# Patient Record
Sex: Female | Born: 1975 | Race: White | Hispanic: No | Marital: Married | Smoking: Former smoker
Health system: Southern US, Community
[De-identification: ages and names within clinical notes are randomized; demographics above are authoritative.]

## PROBLEM LIST (undated history)

## (undated) DIAGNOSIS — M1990 Unspecified osteoarthritis, unspecified site: Secondary | ICD-10-CM

## (undated) DIAGNOSIS — R51 Headache: Secondary | ICD-10-CM

## (undated) DIAGNOSIS — R519 Headache, unspecified: Secondary | ICD-10-CM

## (undated) DIAGNOSIS — K589 Irritable bowel syndrome without diarrhea: Secondary | ICD-10-CM

## (undated) DIAGNOSIS — G4731 Primary central sleep apnea: Secondary | ICD-10-CM

## (undated) DIAGNOSIS — R413 Other amnesia: Secondary | ICD-10-CM

## (undated) DIAGNOSIS — G8929 Other chronic pain: Secondary | ICD-10-CM

## (undated) DIAGNOSIS — G4739 Other sleep apnea: Secondary | ICD-10-CM

## (undated) DIAGNOSIS — M797 Fibromyalgia: Secondary | ICD-10-CM

## (undated) HISTORY — DX: Primary central sleep apnea: G47.31

## (undated) HISTORY — PX: OTHER SURGICAL HISTORY: SHX169

## (undated) HISTORY — DX: Headache: R51

## (undated) HISTORY — DX: Other amnesia: R41.3

## (undated) HISTORY — PX: TUBAL LIGATION: SHX77

## (undated) HISTORY — DX: Fibromyalgia: M79.7

## (undated) HISTORY — DX: Other sleep apnea: G47.39

## (undated) HISTORY — DX: Headache, unspecified: R51.9

## (undated) HISTORY — DX: Unspecified osteoarthritis, unspecified site: M19.90

## (undated) HISTORY — DX: Other chronic pain: G89.29

## (undated) HISTORY — DX: Irritable bowel syndrome, unspecified: K58.9

---

## 1998-09-08 ENCOUNTER — Encounter: Payer: Self-pay | Admitting: *Deleted

## 1998-09-08 ENCOUNTER — Observation Stay (HOSPITAL_COMMUNITY): Admission: AD | Admit: 1998-09-08 | Discharge: 1998-09-09 | Payer: Self-pay | Admitting: *Deleted

## 1998-11-10 ENCOUNTER — Other Ambulatory Visit: Admission: RE | Admit: 1998-11-10 | Discharge: 1998-11-10 | Payer: Self-pay | Admitting: *Deleted

## 1998-11-10 ENCOUNTER — Encounter (INDEPENDENT_AMBULATORY_CARE_PROVIDER_SITE_OTHER): Payer: Self-pay | Admitting: Specialist

## 1998-11-23 ENCOUNTER — Inpatient Hospital Stay (HOSPITAL_COMMUNITY): Admission: AD | Admit: 1998-11-23 | Discharge: 1998-11-23 | Payer: Self-pay | Admitting: Obstetrics & Gynecology

## 1999-01-22 ENCOUNTER — Ambulatory Visit (HOSPITAL_COMMUNITY): Admission: RE | Admit: 1999-01-22 | Discharge: 1999-01-22 | Payer: Self-pay | Admitting: *Deleted

## 2000-03-31 ENCOUNTER — Other Ambulatory Visit: Admission: RE | Admit: 2000-03-31 | Discharge: 2000-03-31 | Payer: Self-pay | Admitting: Obstetrics and Gynecology

## 2000-04-26 ENCOUNTER — Ambulatory Visit (HOSPITAL_COMMUNITY): Admission: RE | Admit: 2000-04-26 | Discharge: 2000-04-26 | Payer: Self-pay | Admitting: Obstetrics and Gynecology

## 2000-12-16 ENCOUNTER — Ambulatory Visit (HOSPITAL_COMMUNITY): Admission: RE | Admit: 2000-12-16 | Discharge: 2000-12-16 | Payer: Self-pay | Admitting: *Deleted

## 2001-09-20 ENCOUNTER — Other Ambulatory Visit: Admission: RE | Admit: 2001-09-20 | Discharge: 2001-09-20 | Payer: Self-pay | Admitting: *Deleted

## 2001-10-16 ENCOUNTER — Ambulatory Visit (HOSPITAL_COMMUNITY): Admission: RE | Admit: 2001-10-16 | Discharge: 2001-10-16 | Payer: Self-pay | Admitting: *Deleted

## 2001-11-08 ENCOUNTER — Inpatient Hospital Stay (HOSPITAL_COMMUNITY): Admission: RE | Admit: 2001-11-08 | Discharge: 2001-11-08 | Payer: Self-pay | Admitting: *Deleted

## 2002-04-09 ENCOUNTER — Inpatient Hospital Stay (HOSPITAL_COMMUNITY): Admission: AD | Admit: 2002-04-09 | Discharge: 2002-04-09 | Payer: Self-pay | Admitting: *Deleted

## 2002-11-13 ENCOUNTER — Other Ambulatory Visit: Admission: RE | Admit: 2002-11-13 | Discharge: 2002-11-13 | Payer: Self-pay | Admitting: *Deleted

## 2004-10-29 ENCOUNTER — Other Ambulatory Visit: Admission: RE | Admit: 2004-10-29 | Discharge: 2004-10-29 | Payer: Self-pay | Admitting: Obstetrics and Gynecology

## 2005-03-15 ENCOUNTER — Encounter: Admission: RE | Admit: 2005-03-15 | Discharge: 2005-03-15 | Payer: Self-pay | Admitting: Obstetrics and Gynecology

## 2006-01-14 ENCOUNTER — Other Ambulatory Visit: Admission: RE | Admit: 2006-01-14 | Discharge: 2006-01-14 | Payer: Self-pay | Admitting: Obstetrics and Gynecology

## 2006-05-09 ENCOUNTER — Ambulatory Visit (HOSPITAL_COMMUNITY): Admission: RE | Admit: 2006-05-09 | Discharge: 2006-05-09 | Payer: Self-pay | Admitting: Obstetrics and Gynecology

## 2006-08-11 ENCOUNTER — Other Ambulatory Visit: Admission: RE | Admit: 2006-08-11 | Discharge: 2006-08-11 | Payer: Self-pay | Admitting: Obstetrics and Gynecology

## 2006-11-03 ENCOUNTER — Ambulatory Visit (HOSPITAL_COMMUNITY): Admission: RE | Admit: 2006-11-03 | Discharge: 2006-11-03 | Payer: Self-pay | Admitting: Obstetrics and Gynecology

## 2007-04-04 ENCOUNTER — Inpatient Hospital Stay (HOSPITAL_COMMUNITY): Admission: AD | Admit: 2007-04-04 | Discharge: 2007-04-04 | Payer: Self-pay | Admitting: Obstetrics and Gynecology

## 2007-04-04 ENCOUNTER — Inpatient Hospital Stay (HOSPITAL_COMMUNITY): Admission: AD | Admit: 2007-04-04 | Discharge: 2007-04-06 | Payer: Self-pay | Admitting: Obstetrics and Gynecology

## 2009-03-06 ENCOUNTER — Inpatient Hospital Stay (HOSPITAL_COMMUNITY): Admission: AD | Admit: 2009-03-06 | Discharge: 2009-03-06 | Payer: Self-pay | Admitting: Obstetrics and Gynecology

## 2009-07-08 ENCOUNTER — Inpatient Hospital Stay (HOSPITAL_COMMUNITY): Admission: AD | Admit: 2009-07-08 | Discharge: 2009-07-10 | Payer: Self-pay | Admitting: Obstetrics and Gynecology

## 2009-09-10 ENCOUNTER — Ambulatory Visit (HOSPITAL_COMMUNITY): Admission: RE | Admit: 2009-09-10 | Discharge: 2009-09-10 | Payer: Self-pay | Admitting: Obstetrics and Gynecology

## 2010-01-26 ENCOUNTER — Encounter: Admission: RE | Admit: 2010-01-26 | Discharge: 2010-01-26 | Payer: Self-pay | Admitting: Obstetrics and Gynecology

## 2010-04-19 HISTORY — PX: BREAST CYST ASPIRATION: SHX578

## 2010-05-25 ENCOUNTER — Other Ambulatory Visit: Payer: Self-pay | Admitting: Obstetrics and Gynecology

## 2010-05-25 DIAGNOSIS — N6320 Unspecified lump in the left breast, unspecified quadrant: Secondary | ICD-10-CM

## 2010-05-26 ENCOUNTER — Ambulatory Visit
Admission: RE | Admit: 2010-05-26 | Discharge: 2010-05-26 | Disposition: A | Discharge status: Home / Self Care | Payer: BC Managed Care – PPO | Source: Ambulatory Visit | Attending: Obstetrics and Gynecology | Admitting: Obstetrics and Gynecology

## 2010-05-26 ENCOUNTER — Other Ambulatory Visit: Payer: Self-pay | Admitting: Obstetrics and Gynecology

## 2010-05-26 DIAGNOSIS — N6320 Unspecified lump in the left breast, unspecified quadrant: Secondary | ICD-10-CM

## 2010-07-06 LAB — CBC
HCT: 41.6 % (ref 36.0–46.0)
MCHC: 34.2 g/dL (ref 30.0–36.0)
Platelets: 189 10*3/uL (ref 150–400)
RDW: 12.9 % (ref 11.5–15.5)

## 2010-07-06 LAB — PREGNANCY, URINE: Preg Test, Ur: NEGATIVE

## 2010-07-10 LAB — RPR: RPR Ser Ql: NONREACTIVE

## 2010-07-10 LAB — CBC
Hemoglobin: 11.4 g/dL — ABNORMAL LOW (ref 12.0–15.0)
Platelets: 147 10*3/uL — ABNORMAL LOW (ref 150–400)
RBC: 3.52 MIL/uL — ABNORMAL LOW (ref 3.87–5.11)
WBC: 13.4 10*3/uL — ABNORMAL HIGH (ref 4.0–10.5)

## 2010-09-04 NOTE — Op Note (Signed)
[facility] of [facility]  Patient:    Regina Long, Regina Long Visit Number: [id number] MRN: [id number]          Service Type: DSU Location: [facility] Attending Physician:  Pleas Koch Dictated by:   Georgina Peer, M.D. Proc. Date: 10/16/01 Admit Date:  10/16/2001                             Operative Report  PREOPERATIVE DIAGNOSES:       1. Left lower quadrant and chronic pelvic pain.                               2. History of endometriosis.  POSTOPERATIVE DIAGNOSES:      1. Left lower quadrant and chronic pelvic pain.                               2. History of endometriosis.  OPERATIONS PERFORMED:         1. Diagnostic laparoscopy.                               2. Fulguration of endometriosis of left ovarian                                  fossa and cul-de-sac.                               3. Uterine and ovarian transection.  SURGEON:                      Georgina Peer, M.D.  ANESTHESIA:                   General with a total of 9 cc of 0.25% Marcaine in the skin incisions.  COMPLICATIONS:                None.  FINDINGS:                     Superficial implants of endometriosis of the left ovarian fossa, cul-de-sac and uterosacral ligaments.  Normal tubes and ovaries.  Normal upper abdomen.  INDICATIONS:                  A 35 year old white female with a history of endometriosis and previous laparoscopy x2.  The patient has been on Depo-Provera for suppression, but continues to have left lower quadrant and pelvic pain.  She uses Darvocet, Bextra and a TENS unit for this without optimal relief.  She understands the risks and complications of laparoscopy. Also, that no disease might be found or that any treatment may not improve her pain symptoms.  She is willing to proceed.  DESCRIPTION OF PROCEDURE:     The patient was taken to the operating room after informed consent, given a general anesthetic and placed in the dorsal lithotomy position.  The  abdomen, perineum and vagina were cleansed with Betadine and sterilely draped.  A catheter emptied the bladder.  Bimanual revealed a retroflexed uterus which was mobile with no uterosacral nodularity and no adnexal mass.  After injecting the skin sites with 0.25%  Marcaine, a vertical subumbilical incision was made and 2.5 L of carbon dioxide gas was insufflated to create pneumoperitoneum under low pressure.  Under direct vision, suprapubic and right lower quadrant incisions were made after again injecting 0.25% Marcaine.  The following pelvic findings were noted once all the ports were placed.  There appeared to be no entry from laparoscopic trocar placement.  There appeared to be no upper abdominal disease.  The tubes and ovaries were freely mobile.  Behind the left ovary, there was one filmy adhesion that was lysed.  There was evidence of neovascularization and implants of endometriosis in the left ovarian fossa over the ureter into the area of the left uterosacral ligament.  The cul-de-sac had multiple implants and neovascularization, but no retraction pockets or stellate scarring.  Both red and clear blebs of endometriosis were noted.  This extended all the way to the right uterosacral ligament, but not into the right ovarian fossa or the other peritoneal surfaces.  The tubes and ovaries were clear.  Using a harmonic scalpel with the dissecting probe, blunt ablation of the endometriosis implants was accomplished.  Superficial ablation over the left ureter and in the left ovarian fossa was accomplished.  Uterosacral transection using the dissecting hook, taking special care to avoid the ureteral courses was also accomplished.  There was excellent hemostasis.  A Nezhat irrigator removed any debris.  Photo documentation of all these findings was made with still pictures.  At this point, the procedure was terminated.  The patient was awakened after all ports were removed and the skin  incisions closed with Dexon.  The vaginal instruments were removed that had been placed to manipulate the uterus.  Sponge, needle and instrument counts were correct.  The patient was transferred to the recovery area in good condition. Dictated by:   Georgina Peer, M.D. Attending Physician:  Pleas Koch DD:  10/16/01 TD:  10/16/01 Job: [id number]

## 2010-09-04 NOTE — Op Note (Signed)
[facility] of [facility]  Patient:    Regina Long, Regina Long                         MRN: [id number] Proc. Date: 04/26/00 Attending:  Fayrene Fearing A. Ashley Royalty, M.D.                           Operative Report  PREOPERATIVE DIAGNOSES:       1. Pelvic pain.                               2. History of endometriosis.  POSTOPERATIVE DIAGNOSES:      1. Pelvic pain.                               2. Adhesion (minimal) from the left colon                                  to the left pelvic side wall.  PROCEDURE:                    1. Diagnostic/operative laparoscopy.                               2. Lysis of adhesions.  SURGEON:                      Rudy Jew. Ashley Royalty, M.D.  ANESTHESIA:                   General endotracheal.  ESTIMATED BLOOD LOSS:         Less than 25 cc.  COMPLICATIONS:                None.  PACKS/DRAINS:                 None.  DESCRIPTION OF PROCEDURE:     The patient was taken to the operating room and placed in the dorsal supine position, where adequate general endotracheal anesthesia was administered.  She was placed in the lithotomy position and prepped and draped in the usual manner for abdominal and vaginal surgery.  A posterior weighted retractor was placed in the vagina, and the anterior lip of the cervix was grasped with a single tooth tenaculum.  The trocar and uterine manipulator were placed per cervix and held in place with the tenaculum.  The bladder was drained with a red rubber catheter.  Next, approximately 5 cc of 0.25% Marcaine was instilled into the inferior aspect of the umbilicus.  A longitudinal incision was made through the patients old laparoscopy incision. The size #1011 disposable trocar was then placed into the abdominal cavity. Its location was verified by the placement of the laparoscope. There was no evidence of any trauma.  A pneumoperitoneum was created and maintained with CO2.  Next, a 5 mm suprapubic trocar was placed using  direct visualization and transillumination techniques.  The pelvis was thoroughly surveyed.  The uterus was normal size, shape, and contour, without evidence of any endometriosis or cysts.  The left and right ovaries were normal size, shape, and contour, without evidence of any cysts or endometritis.  The fallopian tubes were bilaterally normal  size, shape, contour, and length, with luxuriant fimbria bilaterally.  The peritoneal surfaces were smooth and glistening.  The posterior cul-de-sac was cleaned as well.  There was a small adhesion from the left colon to the left pelvic side wall.  Using the bipolar cautery, this was cauterized and divided with the scissors, without difficulty.  Hemostasis was noted.  Appropriate photos were obtained.  At this point the patient was felt to have benefited maximally from the surgical procedure.  The abdominal instruments were removed and the pneumoperitoneum evacuated.  The fascial defects were closed with #0 Vicryl.  The skin was closed with DermaBond.  The patient tolerated the procedure extremely well.  The vaginal instruments were removed, and she was taken to the recovery room in excellent condition. DD:  04/26/00 TD:  04/26/00 Job: [id number]

## 2010-09-04 NOTE — H&P (Signed)
[facility]  Patient:    Regina Long, Regina Long                         MRN: [id number] Adm. Date:  04/26/00 Attending:  Fayrene Fearing A. Ashley Royalty, M.D.                         History and Physical  HISTORY OF PRESENT ILLNESS:   This is a 35 year old nulligravida referred through the courtesy of Melbourne Abts, M.D., for further evaluation and therapy of endometriosis.  The patient had a diagnostic/operative laparoscopy October 2000, which revealed endometriotic implants in the cul-de-sac and left ovarian fossa.  They were fulgurated at the time of the procedure.  The patient did well initially postoperatively.  However, in the spring of 2001, she began to have pelvic pain and postcoital discomfort.  She was begun on Lupron Depot, which resulted in no significant improvement.  In July 2001, her symptoms became spontaneously better.  Then in August they were substantially worse. In October she was requiring hydrocodone on a regular basis to maintain normal function and, thus, requests operative intervention.  MEDICATIONS:                  Hydrocodone.  PAST MEDICAL HISTORY:         Negative.  PAST SURGICAL HISTORY:        Laparoscopy as above.  ALLERGIES:                    None.  FAMILY HISTORY:               Positive for hypertension, diabetes, and breast carcinoma.  SOCIAL HISTORY:               Denies use of tobacco or significant alcohol.  REVIEW OF SYSTEMS:            Noncontributory.  PHYSICAL EXAMINATION:  GENERAL:                      Well-developed, well-nourished, pleasant white female in no acute distress.  VITAL SIGNS:                  Afebrile, vital signs stable.  SKIN:                         Warm and dry, without lesions.  LYMPH:                        No supraclavicular, cervical, or inguinal adenopathy.  HEENT:                        Normocephalic.  NECK:                         Supple.  Without thyromegaly.  CHEST:                         Clear.  LUNGS:                        Clear.  CARDIAC:                      Regular rate and rhythm.  Without murmurs, gallops, or rubs.  BREASTS:                      Deferred.  ABDOMEN:                      Soft and nontender.  Without masses or organomegaly.  Bowel sounds active.  MUSCULOSKELETAL:              Full range of motion without edema, cyanosis, or CVA tenderness.  PELVIC:                       Deferred until examination under anesthesia.  IMPRESSION:                   1. Endometriosis.                               2. Status post diagnostic/operative laparoscopy                                  October 2000, for same.                               3. Pelvic pain and postcoital discomfort,                                  debilitating, probably secondary to #1.  PLAN:                         Diagnostic/operative laparoscopy.  Risks, benefits, ______ , anal trauma ______  fully discussed with the patient. Possible need for unilateral salpingo-oophorectomy discussed and accepted. Possible need for exploratory laparotomy discussed and accepted.  Questions were invited and then answered. DD:  04/25/00 TD:  04/25/00 Job: [id number]

## 2011-01-22 LAB — CBC
HCT: 31.9 — ABNORMAL LOW
HCT: 39.7
Hemoglobin: 11.1 — ABNORMAL LOW
Hemoglobin: 13.8
MCHC: 34.7
MCHC: 34.8
MCV: 93.4
RBC: 4.25
RDW: 12.8

## 2011-01-22 LAB — CCBB MATERNAL DONOR DRAW

## 2011-11-15 ENCOUNTER — Encounter (HOSPITAL_BASED_OUTPATIENT_CLINIC_OR_DEPARTMENT_OTHER): Payer: BC Managed Care – PPO

## 2011-12-05 ENCOUNTER — Ambulatory Visit (HOSPITAL_BASED_OUTPATIENT_CLINIC_OR_DEPARTMENT_OTHER): Payer: BC Managed Care – PPO | Attending: Internal Medicine

## 2011-12-05 VITALS — Ht 64.0 in | Wt 122.0 lb

## 2011-12-05 DIAGNOSIS — Z79899 Other long term (current) drug therapy: Secondary | ICD-10-CM | POA: Insufficient documentation

## 2011-12-05 DIAGNOSIS — G4733 Obstructive sleep apnea (adult) (pediatric): Secondary | ICD-10-CM | POA: Insufficient documentation

## 2011-12-11 DIAGNOSIS — Z79899 Other long term (current) drug therapy: Secondary | ICD-10-CM

## 2011-12-11 DIAGNOSIS — G4733 Obstructive sleep apnea (adult) (pediatric): Secondary | ICD-10-CM

## 2011-12-11 NOTE — Procedures (Signed)
Regina Long, Regina Long                ACCOUNT NO.:  [id number]  MEDICAL RECORD NO.:  [id number]          PATIENT TYPE:  OUT  LOCATION:  SLEEP CENTER                 FACILITY[facility]  [facility]  PHYSICIAN:  Hyland Mollenkopf D. Maple Hudson, MD, FCCP, FACPDATE OF BIRTH:  May 28, 1974  DATE OF STUDY:  12/05/2011                           NOCTURNAL POLYSOMNOGRAM  REFERRING PHYSICIAN:  Irianna Gilday D. Maple Hudson, MD, FCCP, FACP  REFERRING PHYSICIAN:  Dr. Hassie Bruce.  INDICATION FOR STUDY:  Hypersomnia with sleep apnea.  EPWORTH SLEEPINESS SCORE:  19/24.  BMI 20.9, weight 122 pounds, height 64 inches, neck 12 inches.  HOME MEDICATIONS:  Charted and reviewed.  SLEEP ARCHITECTURE:  Total sleep time 352 minutes with sleep efficiency 88.9%.  Stage I was 9.4%, stage II 81.4%.  Stage III absent, REM 9.2% of total sleep time.  Sleep latency 14 minutes, REM latency 90 minutes. Awake after sleep onset 30 minutes.  Arousal index 14.1.  BEDTIME MEDICATION:  Estazolam, Cymbalta, vitamin B6, Lidoderm.  RESPIRATORY DATA:  Apnea/hypopnea index (AHI) 16.4 per hour.  A total of 96 events were scored including 7 obstructive apneas, 65 central apneas, 1 mixed apnea, 23 hypopneas.  Events were not positional.  REM AHI 24 per hour.  There were not enough early events to meet protocol requirements for application of split protocol, CPAP titration on this study night.  OXYGEN DATA:  Loud snoring with oxygen desaturation to a nadir of 86% and mean oxygen saturation through the study of 96.4% on room air.  CARDIAC DATA:  Normal sinus rhythm.  MOVEMENT/PARASOMNIA:  No significant movement disturbance.  Bathroom x1.  IMPRESSION/RECOMMENDATION: 1. Moderate obstructive sleep apnea/hypopnea syndrome, AHI 16.4 per     hour.  Non-positional events.  Loud snoring with oxygen     desaturation to a nadir of 86% and a mean oxygen saturation through     the study of 96.4% on room air. 2. There were insufficient early events to meet protocol  requirements     for application of split protocol, CPAP     titration on this study night.  Consider return for dedicated CPAP     titration or evaluate for alternative management as clinically     appropriate.     Robbin Loughmiller D. Maple Hudson, MD, [facility], FACP Diplomate, American Board of Sleep Medicine    CDY/MEDQ  D:  12/11/2011 09:20:07  T:  12/11/2011 10:19:38  Job:  [id number]

## 2011-12-30 ENCOUNTER — Ambulatory Visit (HOSPITAL_BASED_OUTPATIENT_CLINIC_OR_DEPARTMENT_OTHER): Payer: BC Managed Care – PPO | Attending: Internal Medicine

## 2011-12-30 VITALS — Ht 64.0 in | Wt 122.0 lb

## 2011-12-30 DIAGNOSIS — G4733 Obstructive sleep apnea (adult) (pediatric): Secondary | ICD-10-CM

## 2011-12-30 DIAGNOSIS — G4710 Hypersomnia, unspecified: Secondary | ICD-10-CM | POA: Insufficient documentation

## 2011-12-30 DIAGNOSIS — G4730 Sleep apnea, unspecified: Secondary | ICD-10-CM | POA: Insufficient documentation

## 2012-01-08 DIAGNOSIS — G4710 Hypersomnia, unspecified: Secondary | ICD-10-CM

## 2012-01-08 DIAGNOSIS — G4730 Sleep apnea, unspecified: Secondary | ICD-10-CM

## 2012-01-09 NOTE — Procedures (Signed)
Regina Long, Regina Long                ACCOUNT NO.:  [id number]  MEDICAL RECORD NO.:  [id number]          PATIENT TYPE:  OUT  LOCATION:  SLEEP CENTER                 FACILITY:  [facility]  PHYSICIAN:  Owen Pagnotta D. Maple Hudson, MD, FCCP, FACPDATE OF BIRTH:  04/17/1975  DATE OF STUDY:  12/30/2011                           NOCTURNAL POLYSOMNOGRAM  REFERRING PHYSICIAN:  Leonette Most Lapp  INDICATION FOR STUDY:  Hypersomnia with sleep apnea.  EPWORTH SLEEPINESS SCORE:  19/24.  BMI 20.9, weight 122 pounds, height 64 inches, neck 12 inches.  MEDICATIONS:  Home medications are charted and reviewed.  A baseline diagnostic NPSG on December 06, 2011, recorded an AHI of 16.4 per hour.  Body weight was 222 pounds for that study.  CPAP titration is now requested.  SLEEP ARCHITECTURE:  Total sleep time 327.5 minutes with sleep efficiency 92.4%.  Stage I was 3.7%, stage II 82.1%, stage III 0.5%, REM 13.7% of total sleep time.  Sleep latency 9.5 minutes, REM latency 72 minutes.  Awake after sleep onset 7.5 minutes.  Arousal index 7.  BEDTIME MEDICATION:  Estazolam, Cymbalta, vitamin B6, Lidoderm.  RESPIRATORY DATA:  CPAP titration protocol.  CPAP was titrated to 10 CWP, AHI 0 per hour.  She wore a Radiographer, therapeutic and Paykel one size mask with heated humidifier.  OXYGEN DATA:  Mean oxygen saturation 96.6% on room air.  Snoring was prevented.  CARDIAC DATA:  Sinus rhythm.  MOVEMENT-PARASOMNIA:  No significant movement disturbance.  No bathroom trips.  IMPRESSIONS-RECOMMENDATIONS: 1. Successful CPAP titration to 10 CWP, AHI 0 per hour.  She wore a     Oceanographer one size mask with heated humidifier.     Snoring was prevented and mean oxygen saturation held 96.6% on room     air. 2. Baseline diagnostic NPSG on December 05, 2011, had recorded an AHI of     16.4 per hour.  Body weight for that study was 122 pounds.     Tichina Koebel D. Maple Hudson, MD, [facility], FACP Diplomate, American Board of Sleep  Medicine    CDY/MEDQ  D:  01/08/2012 14:48:28  T:  01/09/2012 01:29:07  Job:  [id number]

## 2012-02-25 ENCOUNTER — Encounter: Payer: Self-pay | Admitting: Pulmonary Disease

## 2012-02-25 ENCOUNTER — Ambulatory Visit (INDEPENDENT_AMBULATORY_CARE_PROVIDER_SITE_OTHER): Payer: BC Managed Care – PPO | Admitting: Pulmonary Disease

## 2012-02-25 VITALS — BP 110/60 | HR 72 | Temp 98.9°F | Ht 64.0 in

## 2012-02-25 DIAGNOSIS — G4730 Sleep apnea, unspecified: Secondary | ICD-10-CM

## 2012-02-25 DIAGNOSIS — R413 Other amnesia: Secondary | ICD-10-CM

## 2012-02-25 DIAGNOSIS — G4733 Obstructive sleep apnea (adult) (pediatric): Secondary | ICD-10-CM

## 2012-02-25 DIAGNOSIS — G4731 Primary central sleep apnea: Secondary | ICD-10-CM

## 2012-02-25 DIAGNOSIS — IMO0001 Reserved for inherently not codable concepts without codable children: Secondary | ICD-10-CM

## 2012-02-25 DIAGNOSIS — M797 Fibromyalgia: Secondary | ICD-10-CM

## 2012-02-25 NOTE — Progress Notes (Signed)
Chief Complaint  Patient presents with  . Advice Only    she has been on cpap x october. pt has nasal pillows.     History of Present Illness: Regina Long is a 36 y.o. female for evaluation of sleep apnea.  She has noticed difficulty with her sleep for some time.  She snores, and trouble falling asleep and staying asleep.  She feels tired during the day.  As a results she had a sleep study in August 2013 which showed moderate sleep apnea.  She had both central and obstructive events.  She then had a CPAP titration study, and was started on CPAP at 10 cm H2O.  She had trouble with initial set up, but improved after she was changed to nasal mask.  She uses [facility] for her DME.  She goes to bed between 9 and 11 pm.  She has two young children, and her sleep time varies depending on childcare needs.  She takes prosom right before going to bed.  She wakes up several times during the night, usually because of pain from her fibromyalgia.  She has fallen asleep in the bathroom at times while taking a bath.  She used to fall asleep during the day also, but this has improved since she was started on CPAP.  She gets out of bed at 6 am.  She still feels tired and sore during the day, but she is not as sleep as before.  She does not usually get headaches in the morning.  She drinks 4 to 6 cups of coffee during the day.    Her Epworth score is 17 out of 24.  The patient denies sleep walking, sleep talking, bruxism, or nightmares.  There is no history of restless legs.  The patient denies sleep hallucinations, sleep paralysis, or cataplexy.  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  She takes narcotics on a regular basis to help control her pain.  She recently had her dose of soma reduced, and this helped improve her daytime alertness.  Tests: PSG 12/06/11>>AHI 16.4, SpO2 low 86% CPAP titration 12/30/11>>CPAP 10 cm H2O CPAP 01/18/12 to 02/24/12>>Used  on 38 of 38 nights with average 7 hrs.  Average AHI 8.3 with CPAP 10 cm H2O   Past Medical History  Diagnosis Date  . OSA (obstructive sleep apnea)   . Chronic headache   . Fibromyalgia   . IBS (irritable bowel syndrome)   . DJD (degenerative joint disease)   . Arthritis   . Short-term memory loss     Past Surgical History  Procedure Date  . Tubal ligation   . 3 laproscopies     endometriosis    Current Outpatient Prescriptions on File Prior to Visit  Medication Sig Dispense Refill  . estazolam (PROSOM) 2 MG tablet Once a day      . gabapentin (NEURONTIN) 600 MG tablet 3 times daily        No Known Allergies  Family History  Problem Relation Age of Onset  . Prostate cancer Father   . Cancer      grandfather  . Heart disease      father side of family  . Heart attack Maternal Grandfather     History  Substance Use Topics  . Smoking status: Former Smoker -- 3 years    Quit date: 04/19/2004  . Smokeless tobacco: Not on file     Comment: <1/2 ppd  .  Alcohol Use: No     Physical Exam: Filed Vitals:   02/25/12 1512 02/25/12 1514  BP:  110/60  Pulse:  72  Temp: 98.9 F (37.2 C)   TempSrc: Oral   Height: 5\' 4"  (1.626 m)   SpO2:  99%  ,  Current Encounter SPO2  02/25/12 1514 99%    Wt Readings from Last 3 Encounters:  12/30/11 122 lb (55.339 kg)  12/05/11 122 lb (55.339 kg)    There is no weight on file to calculate BMI.   General - No distress ENT - No sinus tenderness, no oral exudate, no LAN, no thyromegaly, TM clear, pupils equal/reactive Cardiac - s1s2 regular, no murmur, pulses symmetric Chest - No wheeze/rales/dullness, good air entry, normal respiratory excursion Back - No focal tenderness Abd - Soft, non-tender, no organomegaly, + bowel sounds Ext - No edema Neuro - Normal strength, cranial nerves intact Skin - No rashes Psych - Normal mood, and behavior.   Assessment/Plan:  [name], MD [facility] Pager:  [phone] 02/25/2012, 3:24 PM

## 2012-02-25 NOTE — Patient Instructions (Signed)
Will have CPAP pressure increased to 11 cm H2O Will get download from CPAP machine 2 weeks after pressure change Will get medical records from Dr. Silvana Newness Follow up in 2 months Call if help needed sooner

## 2012-02-28 DIAGNOSIS — R413 Other amnesia: Secondary | ICD-10-CM | POA: Insufficient documentation

## 2012-02-28 DIAGNOSIS — G4731 Primary central sleep apnea: Secondary | ICD-10-CM | POA: Insufficient documentation

## 2012-02-28 DIAGNOSIS — M797 Fibromyalgia: Secondary | ICD-10-CM | POA: Insufficient documentation

## 2012-02-28 NOTE — Assessment & Plan Note (Signed)
She had moderate complex sleep apnea.  She had good results with CPAP 10 cm H2O during titration study.  While her symptoms have improved since starting CPAP, she continues to have daytime somnolence.  She does still have mild elevation in her AHI from recent CPAP download.  Will increase her CPAP to 11 cm H2O, and monitor her response.  If her daytime somnolence persists, and she has better control of her sleep apnea, then she may need to consider using a stimulant medication such as nuvigil on an as needed basis.  I have reviewed her sleep test results with her.  Explained how sleep apnea can affect her health.  Driving precautions were discussed.  Treatment options for sleep apnea were reviewed.  Explained that she did not need to consider using adapt SV at this time for her sleep apnea since she has subjective and objective improvement with CPAP.

## 2012-02-28 NOTE — Assessment & Plan Note (Signed)
I explained the relationship between sleep disruption, such as from sleep apnea, and increased perception of pain.  Also reviewed how some of her pain medications can affect her breathing while asleep.

## 2012-02-28 NOTE — Assessment & Plan Note (Signed)
Have requested medical records in reference to work up for this as this may have some impact on her sleep hygiene.

## 2012-03-23 ENCOUNTER — Telehealth: Payer: Self-pay | Admitting: Pulmonary Disease

## 2012-03-23 NOTE — Telephone Encounter (Signed)
CPAP 02/12/12 to 03/12/12>>Used on 30 of 30 nights with average 6 hrs 48 min.  Average AHI 11.6 with CPAP 11 cm H2O.  Mostly central events.  Results d/w pt.  She is sleeping some better with increase in CPAP to 11 cm H2O.  She would like to give more time with current set up, and then re-assess at [facility] on 05/02/12.

## 2012-04-21 ENCOUNTER — Telehealth: Payer: Self-pay | Admitting: Pulmonary Disease

## 2012-04-21 DIAGNOSIS — G4733 Obstructive sleep apnea (adult) (pediatric): Secondary | ICD-10-CM

## 2012-04-21 NOTE — Telephone Encounter (Signed)
Called, spoke with pt.  States she had decided to hold off on having cpap pressure increased.  She would like to know if Dr. Craige Cotta will place an order now to get another recent download.  States she still feels dehydrated but is using more moisture and still feels like she "could still use more air in my body" when she wakes up.  States she feels like she doesn't have "enough air in my body."  If the download doesn't show things have improved, she would like to go ahead and increase pressure prior to her next OV with Dr. Craige Cotta on May 02, 2012.  Dr. Craige Cotta, pls advise.  Thank you.

## 2012-04-25 NOTE — Telephone Encounter (Signed)
Please ensure download is available prior to her appointment on 05/02/12.  I have placed order with [facility].

## 2012-04-25 NOTE — Telephone Encounter (Signed)
Pt aware. Please advise PCC's thanks

## 2012-04-27 ENCOUNTER — Telehealth: Payer: Self-pay | Admitting: Pulmonary Disease

## 2012-04-27 DIAGNOSIS — G4733 Obstructive sleep apnea (adult) (pediatric): Secondary | ICD-10-CM

## 2012-04-27 NOTE — Telephone Encounter (Signed)
CPAP 03/27/12 to 04/25/12 >> Used on 21 of 30 nights with average 6 hrs 31 min.  Average AHI 7.6 (mostly centrals) with CPAP 11 cm H2O.  Left message detailing download results.    Will need to arrange for ONO with CPAP, and then determine is she may benefit from supplemental oxygen in addition to CPAP vs increase in CPAP pressures vs transition to BiPAP.  Advised her to call back with questions.  Otherwise will discuss in more detail at her next ROV on 05/02/12.

## 2012-05-02 ENCOUNTER — Encounter: Payer: Self-pay | Admitting: Pulmonary Disease

## 2012-05-02 ENCOUNTER — Other Ambulatory Visit (HOSPITAL_BASED_OUTPATIENT_CLINIC_OR_DEPARTMENT_OTHER): Payer: BC Managed Care – PPO

## 2012-05-02 ENCOUNTER — Ambulatory Visit (INDEPENDENT_AMBULATORY_CARE_PROVIDER_SITE_OTHER): Payer: BC Managed Care – PPO | Admitting: Pulmonary Disease

## 2012-05-02 VITALS — BP 98/68 | HR 64 | Temp 98.4°F | Ht 64.0 in | Wt 130.0 lb

## 2012-05-02 DIAGNOSIS — IMO0001 Reserved for inherently not codable concepts without codable children: Secondary | ICD-10-CM

## 2012-05-02 DIAGNOSIS — G4733 Obstructive sleep apnea (adult) (pediatric): Secondary | ICD-10-CM

## 2012-05-02 DIAGNOSIS — M797 Fibromyalgia: Secondary | ICD-10-CM

## 2012-05-02 DIAGNOSIS — G4730 Sleep apnea, unspecified: Secondary | ICD-10-CM

## 2012-05-02 DIAGNOSIS — G4731 Primary central sleep apnea: Secondary | ICD-10-CM

## 2012-05-02 NOTE — Assessment & Plan Note (Signed)
Her daytime fatigue seems out of proportion to what I would expect from her sleep apnea based on her download results.  Advised her to d/w her other physicians.

## 2012-05-02 NOTE — Assessment & Plan Note (Signed)
Her recent download showed increase in central apneas.  Will get ONO with CPAP results and call her.  Depending on results will determine if she needs to use supplemental oxygen with CPAP vs changing to BiPAP.  She is reluctant to have repeat in lab studies at this time due to expense.  Will also arrange for mask refit.

## 2012-05-02 NOTE — Progress Notes (Signed)
Chief Complaint  Patient presents with  . Follow-up    pt here to discuss CPAP download. pt still having problems w/ mask but is doing better. had ONO last night.    History of Present Illness: Regina Long is a 37 y.o. 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  She continues to feel tired and sleepy during the day.  She has to tighten her mask to get it to fit.   TESTS: PSG 12/06/11>>AHI 16.4, SpO2 low 86%  CPAP titration 12/30/11>>CPAP 10 cm H2O  CPAP 01/18/12 to 02/24/12>>Used on 38 of 38 nights with average 7 hrs. Average AHI 8.3 with CPAP 10 cm H2O CPAP 02/12/12 to 03/12/12>>Used on 30 of 30 nights with average 6 hrs 48 min. Average AHI 11.6 with CPAP 11 cm H2O. Mostly central events. CPAP 03/27/12 to 04/25/12 >> Used on 21 of 30 nights with average 6 hrs 31 min. Average AHI 7.6 (mostly centrals) with CPAP 11 cm H2O.   Past Medical History  Diagnosis Date  . OSA (obstructive sleep apnea)   . Chronic headache   . Fibromyalgia   . IBS (irritable bowel syndrome)   . DJD (degenerative joint disease)   . Arthritis   . Short-term memory loss     Past Surgical History  Procedure Date  . Tubal ligation   . 3 laproscopies     endometriosis    Outpatient Encounter Prescriptions as of 05/02/2012  Medication Sig Dispense Refill  . carisoprodol (SOMA) 350 MG tablet Once a day      . Cholecalciferol (HM VITAMIN D3) 4000 UNITS CAPS Take 2,000 Units by mouth. Evening and bedtime      . diclofenac sodium (VOLTAREN) 1 % GEL Apply topically as needed.      . DULoxetine (CYMBALTA) 60 MG capsule Take 60 mg by mouth daily.      Marland Kitchen estazolam (PROSOM) 2 MG tablet Once a day      . FLUoxetine HCl (PROZAC PO) Take by mouth. As directed      . gabapentin (NEURONTIN) 600 MG tablet 3 times daily      . HYDROcodone-acetaminophen (NORCO) 10-325 MG per tablet Take 1 tablet by mouth every 6 (six) hours as needed.      Marland Kitchen HYDROcodone-acetaminophen (NORCO) 7.5-325 MG per tablet Take 1 tablet by mouth every 6  (six) hours as needed.      . lidocaine (LIDODERM) 5 % As needed      . lidocaine (LINDAMANTLE) 3 % CREA cream As needed      . Melatonin 3 MG TABS Take 1 tablet by mouth at bedtime.      . methylphenidate (RITALIN) 20 MG tablet Take 20 mg by mouth daily.      Marland Kitchen morphine (KADIAN) 50 MG 24 hr capsule Once a day      . morphine (MSIR) 30 MG tablet Take 30 mg by mouth 2 (two) times daily.      Marland Kitchen pyridOXINE (VITAMIN B-6) 100 MG tablet Once a month        No Known Allergies  Physical Exam:  Filed Vitals:   05/02/12 0957 05/02/12 0958  BP:  98/68  Pulse:  64  Temp: 98.4 F (36.9 C)   TempSrc: Oral   Height: 5\' 4"  (1.626 m)   Weight: 130 lb (58.968 kg)   SpO2:  100%     Current Encounter SPO2  05/02/12 0958 100%  02/25/12 1514 99%     Body mass index is 22.31 kg/(m^2).  Wt Readings from Last 2 Encounters:  05/02/12 130 lb (58.968 kg)  12/30/11 122 lb (55.339 kg)     General - No distress ENT - No sinus tenderness, no oral exudate, no LAN Cardiac - s1s2 regular, no murmur Chest - No wheeze/rales/dullness Back - No focal tenderness Abd - Soft, non-tender Ext - No edema Neuro - Normal strength Skin - No rashes Psych - normal mood, and behavior   Assessment/Plan:  [name], MD [facility] Pager:  [phone] 05/02/2012, 10:04 AM

## 2012-05-02 NOTE — Patient Instructions (Signed)
Will call with results of overnight oxygen test Will arrange for mask refit at sleep lab Follow up in 3 months

## 2012-05-11 ENCOUNTER — Telehealth: Payer: Self-pay | Admitting: Pulmonary Disease

## 2012-05-11 NOTE — Telephone Encounter (Signed)
ONO with CPAP and RA 05/01/12 >> Test time 6 hrs 19 min.  Mean SpO2 97%, low SpO2 79%.  Spent 12 sec with SpO2 < 88%.  Results d/w pt.  She reports that she had paradoxical response to ritalin, and this caused more daytime fatigue.  She has some improvement since this was discontinued.  She has not been able to go to sleep lab yet to have CPAP mask refit.  She will schedule this next week.    Advised her to call back after mask refit if she continues to have trouble.  Would then need to consider transition from CPAP to BiPAP due to residual central apneas.

## 2012-05-23 ENCOUNTER — Telehealth: Payer: Self-pay | Admitting: Pulmonary Disease

## 2012-05-23 NOTE — Telephone Encounter (Signed)
Noted  

## 2012-05-23 NOTE — Telephone Encounter (Signed)
I will forward this message to Dr. Craige Cotta so that he will be aware of this information.

## 2012-05-25 ENCOUNTER — Encounter: Payer: Self-pay | Admitting: Pulmonary Disease

## 2012-05-30 ENCOUNTER — Telehealth: Payer: Self-pay | Admitting: Pulmonary Disease

## 2012-05-30 NOTE — Telephone Encounter (Signed)
I spoke with pt and she stated the mask fit she tried did not work as well as they had hoped. It was a Multimedia programmer. She is going to try a [place] FX for a week and she how that mask works. This is just FYI for Dr. Craige Cotta. Will forward to him so he is aware.

## 2012-05-30 NOTE — Telephone Encounter (Signed)
Noted  

## 2012-06-30 ENCOUNTER — Telehealth: Payer: Self-pay | Admitting: Pulmonary Disease

## 2012-06-30 DIAGNOSIS — G4733 Obstructive sleep apnea (adult) (pediatric): Secondary | ICD-10-CM

## 2012-06-30 DIAGNOSIS — G4731 Primary central sleep apnea: Secondary | ICD-10-CM

## 2012-06-30 NOTE — Telephone Encounter (Signed)
Pt returned call and can be reached @ [phone]. Regina Long

## 2012-06-30 NOTE — Telephone Encounter (Signed)
CPAP 03/19/12 to 06/14/12 >> Used on 87 of 88 nights with average 6 hrs 40 min.  Average AHI 7.7 with CPAP 11 cm H2O.  Mostly central events.  Left message with voicemail.  Explained she continues to have mild elevation in AHI related to central events.  This is likely related to chronic opioid use.  If she continues to have sleep difficulties and daytime sleepiness, then may need to switch from CPAP to respiratory assist device (RAD), such as BiPAP or adapt SV.  Advised her to call back to discuss in more detail.  Will forward message to my nurse to be aware of plan.

## 2012-07-06 NOTE — Telephone Encounter (Signed)
Pt returned call.  Regina Long ° °

## 2012-07-06 NOTE — Telephone Encounter (Signed)
lmtcb x1 for pt. 

## 2012-07-06 NOTE — Telephone Encounter (Signed)
I spoke with pt. She did receive VS message. She is still not having great success with the mask. Can't get it to seal and it's leaking. She isn't sure if changing the machine will work. Pt aware VS will be in next week. Per pt she would like to speak with VS personally. Please advise Dr. Craige Cotta thanks

## 2012-07-11 NOTE — Telephone Encounter (Signed)
Left message for patient to call back to discuss options about CPAP set up.  Will forward message to my nurse to be aware of plan.

## 2012-07-11 NOTE — Telephone Encounter (Signed)
Pt returned call. She would "really like to be able to talk to dr sood today. [phone]. Regina Long

## 2012-07-18 NOTE — Telephone Encounter (Signed)
Pt returned VS's call & can be reached at [phone].  Pt states that if she does not answer, please call right back & that should work.  Thanks!  Regina Long

## 2012-07-19 NOTE — Telephone Encounter (Signed)
Spoke with pt's husband.  Explained plan for repeat titration study and reasoning behind this.  He understands plan and is agreeable to it.

## 2012-07-19 NOTE — Telephone Encounter (Signed)
Spoke with patient.  Discussed that she has persistent central events on recent download, and this is similar to findings from before.  She continues to have sleep disruption and daytime sleepiness in spite of compliance with CPAP.  I have explained that her central events are likely related to chronic opiate use.  As such CPAP is not adequately controlling this.  Will arrange for in lab study with S9 VPAP adapt.  Will call her husband Alycia Rossetti [phone]) to discuss plan.

## 2012-07-19 NOTE — Telephone Encounter (Signed)
I spoke with Regina Long and she stated she would like to speak with VS. She asked to be called back at [phone]. She is wanting to just to "stop by" since she can't get in touch with Dr. Craige Cotta. I advised her he was seeing Regina Long's and would get the message over to Dr. Craige Cotta. Please advise thanks

## 2012-07-19 NOTE — Telephone Encounter (Signed)
Pt called back & states she will be available before 10 or after 10:30 this am.  Pt states that she may just stop by if she doesn't hear from VS.  I advised pt that VS may not be able to see her since he does have pt's that are scheduled, but that I would make VS aware.  Regina Long

## 2012-08-02 ENCOUNTER — Ambulatory Visit: Payer: BC Managed Care – PPO | Admitting: Pulmonary Disease

## 2012-08-14 ENCOUNTER — Encounter (HOSPITAL_BASED_OUTPATIENT_CLINIC_OR_DEPARTMENT_OTHER): Payer: BC Managed Care – PPO

## 2012-08-17 ENCOUNTER — Ambulatory Visit (HOSPITAL_BASED_OUTPATIENT_CLINIC_OR_DEPARTMENT_OTHER): Payer: BC Managed Care – PPO | Attending: Pulmonary Disease | Admitting: Radiology

## 2012-08-17 VITALS — Ht 60.0 in | Wt 130.0 lb

## 2012-08-17 DIAGNOSIS — G4731 Primary central sleep apnea: Secondary | ICD-10-CM

## 2012-08-17 DIAGNOSIS — G4733 Obstructive sleep apnea (adult) (pediatric): Secondary | ICD-10-CM | POA: Insufficient documentation

## 2012-08-18 ENCOUNTER — Telehealth: Payer: Self-pay | Admitting: Pulmonary Disease

## 2012-08-18 DIAGNOSIS — G4731 Primary central sleep apnea: Secondary | ICD-10-CM

## 2012-08-18 NOTE — Telephone Encounter (Signed)
ATC, NA and no option to leave msg, WCB

## 2012-08-18 NOTE — Telephone Encounter (Signed)
Pt called back and she stated that she did her sleep study last night.  She stated that since oct they have been trying to find a mask to fit her.  She stated that she found one last night and she called to get this from [facility], but they told her that she needs an rx for this.  Pt stated that she has tried about 15 different masks, and none of these work.  Pt stated that she would like to use another DME company.  Would like to change to [facility]---since this is in her network---[address] in [place].  Res-med airFit F10 for her  FFM xtra small-SLM.  Will need to get an rx sent in to [facility] for this mask.  Res-med central machine was used for the sleep study.  Pt only wants to order through [facility].Marland Kitchen  Pt wanted VS know that ALL of the staff at the sleep center are all wonderful, and she is very happy to have to deal with each and every one of them.     VS please advise. thanks

## 2012-08-23 NOTE — Telephone Encounter (Signed)
Spoke with pt and advised that we are still waiting response with VS She verbalized understanding Please advise thanks

## 2012-08-23 NOTE — Telephone Encounter (Signed)
Pt called back & would like to know what the status is.  Please advise.  Pt can be reached at [phone].  Antionette Fairy

## 2012-08-24 ENCOUNTER — Telehealth: Payer: Self-pay | Admitting: Pulmonary Disease

## 2012-08-24 DIAGNOSIS — G4731 Primary central sleep apnea: Secondary | ICD-10-CM

## 2012-08-24 DIAGNOSIS — G4733 Obstructive sleep apnea (adult) (pediatric): Secondary | ICD-10-CM

## 2012-08-24 NOTE — Telephone Encounter (Signed)
Order faxed to [facility] to provide climate tubing for CPAP. Rhonda J Cobb

## 2012-08-24 NOTE — Telephone Encounter (Signed)
See below remarks. Order should have been faxed to [facility] instead of [facility]. Order cancelled with [facility] and faxed to [facility] to provide Climate Tubing. Rhonda J Cobb

## 2012-08-24 NOTE — Telephone Encounter (Signed)
[name] Titration 08/17/12 >> AHI 0 with Max EPAP 15 cm H2), Min EPAP 6 cm H20 and Max PS 15 cm H2O and Min PS 3 cm H2O.  Fitted with ResMed airfit 10 small size full face mask.  Will have my nurse inform pt that pt did very well with new sleep apnea machine for her central sleep apnea.  Order has been placed with to change DME to [facility] and arrange for new set up.  She will need ROV 2 months after set up.

## 2012-08-24 NOTE — Telephone Encounter (Signed)
Per VS okay to send orde.r I have done so. I accidentally put [facility] and it should be [facility]'s thanks

## 2012-08-24 NOTE — Telephone Encounter (Signed)
Spoke with patient made her aware of results as listed below per Dr. Craige Cotta Patient has been scheduled to see Dr. Quintella Baton July 11,2014 at 230pm Nothing further needed at this time

## 2012-08-25 NOTE — Procedures (Signed)
NAMEVRINDA, Regina Long                ACCOUNT NO.:  [id number]  MEDICAL RECORD NO.:  [id number]          PATIENT TYPE:  OUT  LOCATION:  SLEEP CENTER                 FACILITY:  [facility]  PHYSICIAN:  [name], MD        DATE OF BIRTH:  11-01-1975  DATE OF STUDY:  08/17/2012                           NOCTURNAL POLYSOMNOGRAM  REFERRING PHYSICIAN:  [name], MD  FACILITY:  [facility].  INDICATION:  [name] is a 37 year old female who has a history of chronic pain, depression, and fibromyalgia.  She also has persistent sleep disruption with daytime sleepiness.  She had a sleep study from December 05, 2011, which showed moderate sleep apnea.  She had an overall apnea/hypopnea index of 16.4.  Her central apnea index was 11.1, and her obstructive apnea index was 1.2.  This would be consistent with central sleep apnea.  She had been tried on CPAP therapy, but continued to have difficulties with her sleep in spite of multiple attempts at adjusting her CPAP regimen.  She is therefore referred to the sleep lab for a titration study using a adaptive Servo ventilator.  Height is 5 feet 4 inches, weight is 122 pounds, BMI is 22, neck size is 12 inch.  MEDICATIONS:  Carisoprodol, Cymbalta, estazolam, fluoxetine, gabapentin, hydrocodone, Lidoderm patch, melatonin, morphine, and Voltaren.  EPWORTH SLEEPINESS SCORE:  16.  SLEEP ARCHITECTURE:  Total recording time is 385 minutes.  Total sleep time is 361 minutes.  Sleep efficiency 93%.  Sleep latency was 10 minutes.  REM latency was 89 minutes.  The study was notable for lack of slow-wave sleep and the patient slept in both the supine and nonsupine positions.  RESPIRATORY DATA:  The average respiratory rate was 14.  The optimal pressure setting for adaptive Servo ventilator were maximal EPAP pressure of 15 cm water with a minimal EPAP pressure setting of 6 cm water.  Maximal pressure support was 15 cm water and minimal pressure support  settings were 3 cm water.  With these settings, the apnea- hypopnea index was reduced to 0.  In addition, she was observed in REM sleep and supine sleep with these pressure settings.  OXYGEN DATA:  The baseline oxygenation was 100%.  The oxygen saturation nadir was 85%.  The study was conducted without the use of supplemental oxygen.  CARDIAC DATA:  The average heart rate was 72 and the rhythm strip showed sinus rhythm.  MOVEMENT-PARASOMNIA:  The periodic limb movement index was 0 and the patient had no restroom trips.  IMPRESSION:  This was a successful titration study.  Using an Adaptive Servo ventilator with the above settings, her sleep-disordered breathing was well controlled.  She was fitted with a ResMed F10 extra small size full face mask.  I would recommend that the patient be started on adaptive Servo ventilator with the above settings and monitored for her clinical response.     [name], MD Diplomat, American Board of Sleep Medicine    VS/MEDQ  D:  08/24/2012 09:12:38  T:  08/25/2012 00:33:09  Job:  [id number]

## 2012-08-25 NOTE — Telephone Encounter (Signed)
Spoke with Selena Batten at [facility] advised her to cancel order sent to them in error. Order faxed to [facility]. Will contact patient and advise. Rhonda J Cobb Called and was unable to reach patient on home number. Left message that order had been faxed to [facility]. Spoke with Shanda Bumps at [facility] that order was received. Nothing else needed. Advised patient to call me, if she had any additional concerns or issues. Rhonda J Cobb

## 2012-08-30 ENCOUNTER — Telehealth (HOSPITAL_BASED_OUTPATIENT_CLINIC_OR_DEPARTMENT_OTHER): Payer: Self-pay | Admitting: Radiology

## 2012-08-30 NOTE — Progress Notes (Signed)
Regina Long arrived at the [facility] with complaints regarding the fitting of her CPAP mask. The tech instructed the patient on the correct application of the mask. The patient at this time demonstrated clear understanding of the process. Please note that the patient has been fitted with multiple mask,however appears to continue to have problems with finding an interface that works...vlb

## 2012-09-12 ENCOUNTER — Telehealth: Payer: Self-pay | Admitting: Pulmonary Disease

## 2012-09-12 NOTE — Telephone Encounter (Signed)
Spoke to pt. States that she received a letter from [employer] denying her CPAP machine. The letter states that they do not have adequate information to cover the dx Central Sleep Apnea.  Pt just recently had sleep study done.  [employer] phone # is [phone] Fax # [phone]  [facility] - should we fax the newest sleep study to [facility] for them to cover her machine? Please help, thanks ladies!

## 2012-09-13 NOTE — Telephone Encounter (Signed)
Faxed npsg to [facility]

## 2012-09-19 ENCOUNTER — Telehealth (HOSPITAL_BASED_OUTPATIENT_CLINIC_OR_DEPARTMENT_OTHER): Payer: Self-pay | Admitting: *Deleted

## 2012-09-19 ENCOUNTER — Telehealth: Payer: Self-pay | Admitting: Pulmonary Disease

## 2012-09-19 NOTE — Telephone Encounter (Signed)
Noted and will document this in [facility] note and forward to VS as an [place]

## 2012-09-19 NOTE — Telephone Encounter (Signed)
Spoke with Regina Long regarding the CPAP mask that arrived at her home. She stated that she was fitted for an Xtr Small at the lab, but a Small arrived at her home. After researching she was fitted for a ResMed F10 Extra Small in the lab. However, the physician's order to DME shows size: Small. Correct mask will need to be re-ordered.

## 2012-09-20 ENCOUNTER — Encounter: Payer: Self-pay | Admitting: Pulmonary Disease

## 2012-09-21 ENCOUNTER — Telehealth: Payer: Self-pay | Admitting: Pulmonary Disease

## 2012-09-21 NOTE — Telephone Encounter (Signed)
lmomtcb x1 for pt 

## 2012-09-21 NOTE — Telephone Encounter (Signed)
Received fax from [place].  It states that the coverage for BiPAP device has been denied because there is inadequate information to establish that the member has central apnea or complex sleep apnea or another cover indication per coverage policy.  I am going to forward this message to VS and get his take on this. I will also show him the denial letter this afternoon in clinic.

## 2012-09-21 NOTE — Telephone Encounter (Signed)
Please advise Dr. Sood thanks 

## 2012-09-21 NOTE — Telephone Encounter (Signed)
Spoke to the pt. She is aware of VS recommendation. Pt would like to pick up OV notes and her latest sleep study. I will place these up front for her to pick up. Pt will come by and pick them up tomorrow.  I have contacted [facility] on behalf of the pt. Spoke to [place] and asked what documentation they had for the pt. She states that they have her latest sleep study and VS last OV note. A letter was faxed to them from the pt's insurance stating that they don't have proper documentation supporting why the pt needs to be switched from CPAP to BiPAP. Shanda Bumps is going to fax this letter to me so that way we will know exactly what it says. According to her, the insurance company won't allow them to resubmit the request to get this approved.

## 2012-09-21 NOTE — Telephone Encounter (Signed)
Please locate her original sleep study.  This was done at an outside facility.

## 2012-09-21 NOTE — Telephone Encounter (Signed)
Will have my nurse inform pt that she has complex sleep apnea >> this means she has both obstructive and central sleep apnea syndrome.  This is documented in my office notes.  She can request copies of my office notes to be forwarded to her insurance company as needed.

## 2012-09-21 NOTE — Telephone Encounter (Signed)
Duplicate message 09/21/12. Will document in that message

## 2012-09-21 NOTE — Telephone Encounter (Signed)
I spoke with pt as she called back. She states she called [employer] and spoke with medical review dept and a Science writer. She was advised to fill out member appeal form and submit this. She states she was advised to write on this form -the 1st machine she tried and there was no change in her sleep - she has tried 15 different mask and her  -e now has a mask and machine that has fixed both her central and obstructive apneas - How much change she has noticed since being on the new machine and how she has been able to come off some if her medications d/t this.  Per pt [employer] advised her to add this appeal in addition to the doctors appeal. She ask that when we send in information to send in her downloads down off the machine. Although after this is done and [employer] still denies paying for the machine whats next? She states she was told she has the most advanced BIPAP machine so would she try the other BIPAP machine? Would that be as adequate for her? Pt stated she has not slept this good in very long time and does not want the machine taken away from her. Please advise VS thanks

## 2012-09-21 NOTE — Telephone Encounter (Signed)
Returning call can be reached at [phone].Regina Long

## 2012-10-12 NOTE — Telephone Encounter (Signed)
PCC's can you help with this?

## 2012-10-12 NOTE — Telephone Encounter (Signed)
lmtcb for [email or web address]   Tobe Sos

## 2012-10-12 NOTE — Telephone Encounter (Signed)
Her diagnostic sleep study from 12/05/11 showed following: AHI 16.4 >> Central apnea index 11.1, Obstructive apnea index 1.2, Mixed apnea index 0.2, Hypopnea index 3.9.  This is consistent with central sleep apnea.  She had CPAP titration study from 01/11/12: Reported to have AHI of 0 at CPAP 10, but only 10 minutes of sleep time at this pressure setting.  She had persistent central events with lower pressure settings.  CPAP 01/18/12 to 02/24/12>>Used on 38 of 38 nights with average 7 hrs. Average AHI 8.3 with CPAP 10 cm H2O  CPAP 02/12/12 to 03/12/12>>Used on 30 of 30 nights with average 6 hrs 48 min. Average AHI 11.6 with CPAP 11 cm H2O. Mostly central events.  CPAP 03/27/12 to 04/25/12 >> Used on 21 of 30 nights with average 6 hrs 31 min. Average AHI 7.6 (mostly centrals) with CPAP 11 cm H2O.  ONO with CPAP and RA 05/01/12 >> Test time 6 hrs 19 min. Mean SpO2 97%, low SpO2 79%. Spent 12 sec with SpO2 < 88%.  Supplemental oxygen not indicated.  She had mask desensitization 05/02/12.  [name] Titration 08/17/12 >> AHI 0 with Max EPAP 15 cm H2), Min EPAP 6 cm H20 and Max PS 15 cm H2O and Min PS 3 cm H2O. Fitted with ResMed airfit 10 small size full face mask.  Please forward this information to her insurance.  Please inquire if additional information is needed to have insurance approval for her current respiratory assist device.

## 2012-10-13 ENCOUNTER — Telehealth: Payer: Self-pay | Admitting: Pulmonary Disease

## 2012-10-13 NOTE — Telephone Encounter (Signed)
Spoke to pt. She states that she wanted to let us know that she spoke with [facility] about the appeal for the BiPAP machine. According to her, she is going to let us be the one who does the appeal. After speaking with [employer], the RN told her that she should also write a letter that will go along with our appealing stating why she should have the BiPAP. There will be a form that the pt is going to bring to [place] along with her letter that will need to be sent to [facility]. Nothing needs to be done at this time. Pt just wanted to give [place] notice about what is going with the process of the appeal.

## 2012-10-17 NOTE — Telephone Encounter (Signed)
Pt brought in cpap to [facility] and they downloaded and faxed to dr Craige Cotta Tobe Sos

## 2012-10-18 ENCOUNTER — Encounter: Payer: Self-pay | Admitting: Pulmonary Disease

## 2012-10-18 NOTE — Telephone Encounter (Signed)
Information in my phone note from 10/12/12 needs to be sent to her DME and insurance company to ensure that she has insurance coverage for her respiratory assist device.  Please find out what additional information is needed to get her respiratory assist device approved by insurance.  If this has already been approved, then no further action needed.

## 2012-10-27 ENCOUNTER — Ambulatory Visit (INDEPENDENT_AMBULATORY_CARE_PROVIDER_SITE_OTHER): Payer: BC Managed Care – PPO | Admitting: Pulmonary Disease

## 2012-10-27 ENCOUNTER — Encounter: Payer: Self-pay | Admitting: Pulmonary Disease

## 2012-10-27 VITALS — BP 90/60 | HR 56 | Ht 64.0 in | Wt 122.0 lb

## 2012-10-27 DIAGNOSIS — G4731 Primary central sleep apnea: Secondary | ICD-10-CM

## 2012-10-27 DIAGNOSIS — G4730 Sleep apnea, unspecified: Secondary | ICD-10-CM

## 2012-10-27 NOTE — Progress Notes (Signed)
Chief Complaint  Patient presents with  . Sleep Apnea    Needs an appeal done for her BiPAP machine.    History of Present Illness: Regina Long is a 37 y.o. 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  She is sleeping much better since she was started on [name].  She is getting better quality sleep, and feels more energetic during the day.  Her pain symptoms are better as a result.  She is doing well with her current mask.  She is amazed at how much better [name] works compared to CPAP.  Her main concern is insurance company refusal to pay for [name].   TESTS: PSG 12/05/11 >> AHI 16.4, SpO2 low 86% >> Central apnea index 11.1, Obstructive apnea index 1.2, Mixed apnea index 0.2, Hypopnea index 3.9. This is consistent with central sleep apnea.  CPAP titration 01/11/12 >> Reported to have AHI of 0 at CPAP 10, but only 10 minutes of sleep time at this pressure setting. She had persistent central events with lower pressure settings.  CPAP 01/18/12 to 02/24/12 >> Used on 38 of 38 nights with average 7 hrs. Average AHI 8.3 with CPAP 10 cm H2O CPAP 02/12/12 to 03/12/12 >> Used on 30 of 30 nights with average 6 hrs 48 min. Average AHI 11.6 with CPAP 11 cm H2O. Mostly central events. CPAP 03/27/12 to 04/25/12  >>  Used on 21 of 30 nights with average 6 hrs 31 min. Average AHI 7.6 (mostly centrals) with CPAP 11 cm H2O. ONO with CPAP and RA 05/01/12 >> Test time 6 hrs 19 min. Mean SpO2 97%, low SpO2 79%. Spent 12 sec with SpO2 < 88%. Supplemental oxygen not indicated.  05/02/12 CPAP mask desensitization [name] Titration 08/17/12 >> AHI 0 with Max EPAP 15 cm H2), Min EPAP 6 cm H20 and Max PS 15 cm H2O and Min PS 3 cm H2O. Fitted with ResMed airfit 10 small size full face mask. [name] 08/24/12 to 10/13/12 >> used on 49 of 51 nights with average 7 hrs 25 min.  Average AHI 0.2 with Max PS 15 H2O, Min PS 3 H2O, EPAP 6 cm H2O.  She  has a past medical history of Complex sleep apnea syndrome; Chronic headache; Fibromyalgia; IBS (irritable  bowel syndrome); DJD (degenerative joint disease); Arthritis; and Short-term memory loss.  She  has past surgical history that includes Tubal ligation and 3 laproscopies.   Outpatient Encounter Prescriptions as of 10/27/2012  Medication Sig Dispense Refill  . carisoprodol (SOMA) 350 MG tablet Once a day      . Cholecalciferol (HM VITAMIN D3) 4000 UNITS CAPS Take 2,000 Units by mouth. Evening and bedtime      . diclofenac sodium (VOLTAREN) 1 % GEL Apply topically as needed.      . DULoxetine (CYMBALTA) 60 MG capsule Take 60 mg by mouth daily.      Marland Kitchen estazolam (PROSOM) 2 MG tablet Once a day      . FLUoxetine HCl (PROZAC PO) Take by mouth. As directed      . gabapentin (NEURONTIN) 600 MG tablet 3 times daily      . HYDROcodone-acetaminophen (NORCO) 10-325 MG per tablet Take 1 tablet by mouth every 6 (six) hours as needed.      Marland Kitchen HYDROcodone-acetaminophen (NORCO) 7.5-325 MG per tablet Take 1 tablet by mouth every 6 (six) hours as needed.      . lidocaine (LIDODERM) 5 % As needed      . lidocaine (LINDAMANTLE) 3 % CREA cream  As needed      . Melatonin 3 MG TABS Take 1 tablet by mouth at bedtime.      Marland Kitchen morphine (KADIAN) 50 MG 24 hr capsule Once a day      . morphine (MSIR) 30 MG tablet Take 30 mg by mouth 2 (two) times daily.      Marland Kitchen pyridOXINE (VITAMIN B-6) 100 MG tablet Once a month       No facility-administered encounter medications on file as of 10/27/2012.    No Known Allergies  Physical Exam:  General - No distress ENT - No sinus tenderness, no oral exudate, no LAN Cardiac - s1s2 regular, no murmur Chest - No wheeze/rales/dullness Back - No focal tenderness Abd - Soft, non-tender Ext - No edema Neuro - Normal strength Skin - No rashes Psych - normal mood, and behavior   Assessment/Plan:  Regina Helling, MD [place] [facility]/Critical Care/Sleep Pager:  [phone] 10/29/2012, 11:05 AM

## 2012-10-27 NOTE — Patient Instructions (Signed)
Follow up in 6 months 

## 2012-11-03 ENCOUNTER — Encounter: Payer: Self-pay | Admitting: Pulmonary Disease

## 2012-11-03 ENCOUNTER — Telehealth: Payer: Self-pay | Admitting: Pulmonary Disease

## 2012-11-03 NOTE — Telephone Encounter (Signed)
Pt does not need to do anything further as of now on her appeal. Per her request I will not call her back.

## 2012-11-03 NOTE — Telephone Encounter (Signed)
Message copied by [name] on Fri Nov 03, 2012  1:26 PM ------      Message from: Caryl Ada      Created: Mon Oct 30, 2012 11:06 AM       The # is going to be [phone] then chose option 3.            ----- Message -----         From: [name], MD         Sent: 10/29/2012  11:14 AM           To: Caryl Ada, CMA            Can you find out number at [facility] to arrange for Peer to Peer discussion to approve [name] device for Ms. Girardot.            Thanks.       ------

## 2012-11-03 NOTE — Telephone Encounter (Signed)
Pt wants to know if she needs to do anything further in ref to her appeal and if not you don't have to call her back she can be reached at [phone].Regina Long

## 2012-11-03 NOTE — Telephone Encounter (Signed)
LM with the pt letting her know what is going on with her appeal (confidential voicemail) All documents will be faxed to the # below per Dr. Craige Cotta.

## 2012-11-03 NOTE — Telephone Encounter (Signed)
Called listed number and spoke with service representative at [facility]  Advised to fax letter with sleep study to following number:  [phone]  Informed that information will then be reviewed, and then if need Peer-to-Peer review can be arranged.  Will have my nurse fax letter and copies of sleep studies to above number.  Will also have my nurse inform patient of current status of her appeal.

## 2012-11-15 ENCOUNTER — Telehealth: Payer: Self-pay | Admitting: Pulmonary Disease

## 2012-11-15 NOTE — Telephone Encounter (Signed)
Pt is aware that we have not received anything from [facility] about her appeal as of today. I advised that I would keep her in the loop if we receive anything from them. She agreed and verbalized understanding.

## 2012-12-12 ENCOUNTER — Telehealth: Payer: Self-pay | Admitting: Pulmonary Disease

## 2012-12-12 NOTE — Telephone Encounter (Signed)
Pt is aware of download results. She states that she has not heard anything from [facility] about the appeal.

## 2012-12-12 NOTE — Telephone Encounter (Signed)
[name] 10/25/12 to 11/23/12 >> Used on 29 of 30 nights with average 7 hrs 16 min.  Average AHI 0.2 with EPAP 6 cm H2O, Min PS 3 cm H2O, Max PS 15 cm H2O.  Will have my nurse inform pt that [name] report shows excellent control of sleep apnea.  Please also check status of appeal with her insurance company for continued use of [name] to treat central sleep apnea.

## 2013-02-21 ENCOUNTER — Telehealth: Payer: Self-pay | Admitting: Pulmonary Disease

## 2013-02-21 DIAGNOSIS — G4733 Obstructive sleep apnea (adult) (pediatric): Secondary | ICD-10-CM

## 2013-02-21 NOTE — Telephone Encounter (Signed)
Please inform pt that I will need to review her download first before making any recommendations.  Also please confirm with type of device pt has >> in phone note it is mentioned that she has CPAP, but per my Epic records she should be using [name] device.

## 2013-02-21 NOTE — Telephone Encounter (Signed)
Called spoke with patient who reported that her husband has reported her snoring for the past 2 nights and that it was "pretty evident."  Pt stated that she had cleaned her mask and tubing and thought that this had disrupted something >> took her mask/tubing to the sleep center and was told that her supplies are in good condition with no leak and a good seal.  Pt stated that she does not typically mess with her mask while sleeping.  She is aware that weight can affect this and she recently returned from a cruise where she had gained 5-6lbs and reports that she had gained about 5lbs prior to that.  She is working on weight loss and has lost 4lbs since returning home.  Her pressure on her previous machine was 10-11 and knows that her current machine is set at 9 >> she is aware that this difference could be due to the machine but would like to know if this could be a contributing factor.  Pt is wondering if the recent download revealed anything.   If there are any pressure adjustments, she would like Dr Dellia Nims with [facility] to be cc'd.  Dr Craige Cotta please advise, thank you. **pt is aware VS is rounding in the hospital this week **VS paged as well

## 2013-02-21 NOTE — Telephone Encounter (Signed)
Pt is using [name] device. I advised we will call her once download is reviewed. Carron Curie, CMA

## 2013-02-22 ENCOUNTER — Other Ambulatory Visit: Payer: Self-pay

## 2013-03-07 NOTE — Telephone Encounter (Signed)
Spoke with pt. States that she has not had the issue with a "leak" since she brought her chip over here to be downloaded. I placed the download report in Dr. Evlyn Courier look at to be reviewed, but I can't remember if I wrote the pt's name on the report. Pt is fine with [employer] being the one to the download. I will send an order to [facility] to get this taken care of. Pt was not upset that Dr. Craige Cotta had not seen the download report. I apologized for the inconvenience and took full responsibility for this.

## 2013-03-07 NOTE — Telephone Encounter (Signed)
I still have not received her [name] download.  Please check with her DME about when this can be delivered.

## 2013-04-30 ENCOUNTER — Encounter: Payer: Self-pay | Admitting: Pulmonary Disease

## 2013-04-30 ENCOUNTER — Ambulatory Visit (INDEPENDENT_AMBULATORY_CARE_PROVIDER_SITE_OTHER): Payer: BC Managed Care – PPO | Admitting: Pulmonary Disease

## 2013-04-30 VITALS — BP 100/62 | HR 90 | Temp 98.2°F | Ht 64.0 in | Wt 133.0 lb

## 2013-04-30 DIAGNOSIS — G4730 Sleep apnea, unspecified: Secondary | ICD-10-CM

## 2013-04-30 DIAGNOSIS — G4731 Primary central sleep apnea: Secondary | ICD-10-CM

## 2013-04-30 DIAGNOSIS — G4710 Hypersomnia, unspecified: Secondary | ICD-10-CM

## 2013-04-30 DIAGNOSIS — G4719 Other hypersomnia: Secondary | ICD-10-CM

## 2013-04-30 MED ORDER — ARMODAFINIL 150 MG PO TABS: ORAL_TABLET | ORAL | Status: DC

## 2013-04-30 NOTE — Progress Notes (Signed)
Chief Complaint  Patient presents with  . Sleep Apnea    Currenlty using [name] device every night. Denies problems with machine, mask or pressure.    CC: Regina Long [phone])  History of Present Illness: Regina Long is a 38 y.o. 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  She has been doing well with [name].  She is sleeping much better.  Unfortunately she is still very sleepy during the day.  She has fallen asleep in the bath tub, and when using the toilet.  She has also fallen asleep while caring for her children.  She is drinking up to 9 cups of coffee during the day.  She tried ritalin, but this had paradoxical effect of actually making her more sleepy and lethargic.  Her [name] download shows excellent compliance and control of sleep apnea.  TESTS: PSG 12/05/11 >> AHI 16.4, SpO2 low 86% >> Central apnea index 11.1, Obstructive apnea index 1.2, Mixed apnea index 0.2, Hypopnea index 3.9. This is consistent with central sleep apnea.  ONO with CPAP and RA 05/01/12 >> Test time 6 hrs 19 min. Mean SpO2 97%, low SpO2 79%. Spent 12 sec with SpO2 < 88%. Supplemental oxygen not indicated.  05/02/12 CPAP mask desensitization [name] Titration 08/17/12 >> AHI 0 with Max EPAP 15 cm H2), Min EPAP 6 cm H20 and Max PS 15 cm H2O and Min PS 3 cm H2O. Fitted with ResMed airfit 10 small size full face mask. [name] 03/19/13 to 04/17/13 >> Used on 30 of 30 nights with average 6 hrs 48 min.  Average AHI 0.4 with Max PS 15 cm H2O, Min PS 3 H2O, EPAP 6 cm H2O.  She  has a past medical history of Complex sleep apnea syndrome; Chronic headache; Fibromyalgia; IBS (irritable bowel syndrome); DJD (degenerative joint disease); Arthritis; and Short-term memory loss.  She  has past surgical history that includes Tubal ligation and 3 laproscopies.   Outpatient Encounter Prescriptions as of 04/30/2013  Medication Sig  . carisoprodol (SOMA) 350 MG tablet Once a day  . Cholecalciferol (HM VITAMIN D3) 4000 UNITS CAPS Take 2,000 Units  by mouth. Evening and bedtime  . diclofenac sodium (VOLTAREN) 1 % GEL Apply topically as needed.  . DULoxetine (CYMBALTA) 60 MG capsule Take 60 mg by mouth daily.  Marland Kitchen estazolam (PROSOM) 2 MG tablet Once a day  . FLUoxetine HCl (PROZAC PO) Take by mouth. As directed  . gabapentin (NEURONTIN) 600 MG tablet 3 times daily  . HYDROcodone-acetaminophen (NORCO) 10-325 MG per tablet Take 1 tablet by mouth every 6 (six) hours as needed.  Marland Kitchen HYDROcodone-acetaminophen (NORCO) 7.5-325 MG per tablet Take 1 tablet by mouth every 6 (six) hours as needed.  . lidocaine (LIDODERM) 5 % As needed  . lidocaine (LINDAMANTLE) 3 % CREA cream As needed  . Melatonin 5 MG TABS Take 1 tablet by mouth at bedtime.  Marland Kitchen morphine (KADIAN) 50 MG 24 hr capsule Take 50 mg by mouth at bedtime. Once a day  . morphine (MSIR) 30 MG tablet Take 30 mg by mouth 3 (three) times daily.   Marland Kitchen pyridOXINE (VITAMIN B-6) 100 MG tablet Once a month  . [DISCONTINUED] Melatonin 3 MG TABS Take 1 tablet by mouth at bedtime.    No Known Allergies  Physical Exam:  General - No distress ENT - No sinus tenderness, no oral exudate, no LAN Cardiac - s1s2 regular, no murmur Chest - No wheeze/rales/dullness Back - No focal tenderness Abd - Soft, non-tender Ext - No edema Neuro -  Normal strength Skin - No rashes Psych - normal mood, and behavior   Assessment/Plan:  Chesley Mires, MD [place] [facility]/Critical Care/Sleep Pager:  [phone] 04/30/2013, 10:04 AM

## 2013-04-30 NOTE — Patient Instructions (Signed)
Try Nuvigil 150 mg pill daily as needed to help stay awake during the day.  Do not take after 1:30 pm. Try scheduling naps for 15 to 20 minutes during the day Follow up in 6 months

## 2013-05-01 ENCOUNTER — Telehealth: Payer: Self-pay | Admitting: Pulmonary Disease

## 2013-05-01 NOTE — Telephone Encounter (Signed)
Spoke to pt. States that when she was here for her appointment with Dr. Halford Chessman yesterday he prescribed Nuvigil. According to her this will need a prior authorization. I advised her that I will be on the look out for this fax and will take care of as soon as I can.  I will keep this message open to follow up on.

## 2013-05-02 NOTE — Telephone Encounter (Signed)
We have not received the PA from the pharmacy. I contacted [facility] and they are going to fax this to [place].

## 2013-05-04 NOTE — Telephone Encounter (Signed)
Has this been received?  Bing, CMA

## 2013-05-04 NOTE — Telephone Encounter (Signed)
This has already been faxed to her insurance company. Pt is aware that this has been started.

## 2013-05-08 NOTE — Telephone Encounter (Signed)
Unfortunately no other less expensive alternatives.  Please check if pharmacy company has financial assistance options.

## 2013-05-08 NOTE — Telephone Encounter (Signed)
Received a fax that Regina Long has been approved. Pt is aware of this but states that it's going to cost $600 per month for this prescritption. [facility] also told her that Provigil will be $1300 per month. Pt is wanting to know if there is anything like Nuvigil that would not be as expensive.  Regina Long - please advise. Thanks.

## 2013-05-08 NOTE — Telephone Encounter (Signed)
Patient assistance forms have been printed to be completed. Pt is aware and I will contact her once our portion of the form is done so she can come pick them up to complete her portion.  She had a complaint that she wanted me to let Dr. Halford Chessman know about. The past couple of nights she has woke up with "cotton mouth." This is troubling her because she has the heated tube on her machine.  Dr. Halford Chessman do you have any suggestions as to what the pt can do to prevent this?

## 2013-05-11 DIAGNOSIS — G4719 Other hypersomnia: Secondary | ICD-10-CM | POA: Insufficient documentation

## 2013-05-11 NOTE — Assessment & Plan Note (Signed)
Good compliance and control with [name] device.

## 2013-05-11 NOTE — Assessment & Plan Note (Signed)
She has significant residual daytime hypersomnolence impacting her daytime function.  Will have her try nuvigil 150 mg prn on days she requires more alertness.  Also advised her to try scheduling brief naps during the day.

## 2013-05-14 NOTE — Telephone Encounter (Signed)
lmomtcb x1 

## 2013-05-14 NOTE — Telephone Encounter (Signed)
Please advise her that she can try adjusting the temperature setting on the humidifier for her sleep apnea machine.  She can also try using biotene mouth rinse before going to bed.

## 2013-05-14 NOTE — Telephone Encounter (Signed)
Spoke with the pt and notified of recs per VS  She verbalized understanding  Nothing further needed 

## 2013-06-04 ENCOUNTER — Telehealth: Payer: Self-pay | Admitting: Pulmonary Disease

## 2013-06-04 NOTE — Telephone Encounter (Signed)
Called spoke with pt. She reports [employer] advised her peer to peer was to been done regarding her bipap and was not. It was denied bc a precert was not done per pt. Please advise PCC's thanks

## 2013-06-06 ENCOUNTER — Telehealth: Payer: Self-pay | Admitting: Pulmonary Disease

## 2013-06-06 NOTE — Telephone Encounter (Signed)
Duplicate message to message taken on 2.16.15 Will copy and paste into original message and document there.

## 2013-06-06 NOTE — Telephone Encounter (Signed)
Per duplicate message taken on 2.18.15: 06/06/2013 2:56 PM Phone (Incoming) Long, Regina Ericsson (Self) [phone] (H)    pt call in reguard to bi-pap w/ [facility] says is was denied b/c not pre-approved. pls advise    Called spoke with patient who reported that she received a letter from [facility] on 2.6.15 informing her that they are not able to pay for her BiPAP because "a pre-certification was not done."  Ref # [id number].  Pt stated that is having to pay for this device out of pocket at this time and is concerned d/t this cost.  She is calling to see if anything has been discovered regarding this issue what her next step should be.  Advised pt that [place] did send her message to [facility] on 2.16.15, but the with the recent inclement our office has been closed.  Advised pt will speak with [facility] Team Lead) to see what we can find out and our office will call her tomorrow.  Spoke with [facility] and discussed the above with her.  [facility] stated that she will speak with Arbie Cookey at [place] and let me know.

## 2013-06-11 NOTE — Telephone Encounter (Signed)
lmomtcb Sally E Ottinger ° °

## 2013-06-19 NOTE — Telephone Encounter (Signed)
SPOKE TO [email or web address]  she states [employer] sent in request to purchase bipap and it needed to be rented 1st, [employer] [employer] says they have not been paid any rental fee's and pt has had the machine sine 5/14 so there is something that [employer] and [employer] need to work out pt is aware of this Joellen [place]

## 2013-10-04 ENCOUNTER — Telehealth: Payer: Self-pay | Admitting: Pulmonary Disease

## 2013-10-04 NOTE — Telephone Encounter (Signed)
I spoke with the pt and she states she has requested several times for her visit notes to be sent to Dr. Brendia Sacks. She states he is the doctor that originally ordered her first sleep study. I faxed ov notes and download results to Dr. Jeral Fruit per pt requests. Pt is aware. Nothing further is needed.  Corwith Bing, CMA

## 2013-11-12 ENCOUNTER — Telehealth: Payer: Self-pay | Admitting: Pulmonary Disease

## 2013-11-12 NOTE — Telephone Encounter (Signed)
She can get similar [name] machine if she purchases on her own.  Her insurance will not pay for second machine as they are still contesting payment for first machine.  These machines can be several thousand dollars if purchased out of pocket.  Please inquire why she can not take her current device when travelling.

## 2013-11-12 NOTE — Telephone Encounter (Signed)
Please try pt first & if not response call spouse, [name] [phone].  Regina Long

## 2013-11-12 NOTE — Telephone Encounter (Signed)
Pt states that she has been dealing with an appeal process between [place] since about May/June of 2014.  BiPAP machine was given to the patient without having Pre-certification done first.  Patient requesting assistance in getting this settled.  Called with [employer] 08/2012 denial letter received from [facility] letter written by Dr Halford Chessman 11/03/12 2 Appeals filed with [place] by Clementeen Hoof is not seeing when the 1st was filed but the 2nd was filed 02/06/13 Appeal information received from [employer]--does not look like [employer] has followed up [place] never followed up with [place] on a determination Per [place], the patient needs to contact [facility] and have them contact [employer] in 3-way call ([place], [place] billing and the patient)  Joellen Jersey called and spoke with Arbie Cookey [facility] Arbie Cookey is to contact the patient regarding the billing issue as there is nothing further needed from our office in this process. Per Arbie Cookey, they will handle contacting the patient regarding this appeal from here on out.   Called pt husband [name] to make aware that [employer] is to contact them to discuss this appeal issue.

## 2013-11-12 NOTE — Telephone Encounter (Signed)
Can we prove that the forms were sent

## 2013-11-12 NOTE — Telephone Encounter (Signed)
Regina Long called back. She wanted to know if we can fax over the appeal letter Dr. Halford Chessman did last year so she can see what pt is talking about.   I called spouse to make aware of VS recs below and LMTCB x1

## 2013-11-12 NOTE — Telephone Encounter (Signed)
Pt states that she is looking into getting a BiPAP machine for traveling.  Pt states that her plan is to purchase a machine from outside company. Wants to know before purchasing if based on her type of sleep apnea and the type of machine she currently using through [employer]--if Dr Halford Chessman feels she will find a machine and be able to have it set to her required settings. They are not wanting to purchase through [place].  Please advise Dr Halford Chessman, thanks.

## 2013-11-13 NOTE — Telephone Encounter (Signed)
Pt returning call.Regina Long ° °

## 2013-11-13 NOTE — Telephone Encounter (Signed)
lmctb x1

## 2013-11-13 NOTE — Telephone Encounter (Signed)
Copy of appeal letter faxed to carol at [place] at [phone].  LMOMTCB x 1 for pt to make aware of VS recs.

## 2013-11-14 NOTE — Telephone Encounter (Signed)
i have spoken with this pt for a lengthy amount of time, also [email or web address]  it appears that this pt's husband has filed a compliant with BBB and government there are now lawyers involved with this problem ,[email or web address]  can not discuss this problem with either myself or the pt , the pt is aware that nothing can be done at this point because the lawyer has gotten involved Regina Long

## 2013-11-14 NOTE — Telephone Encounter (Signed)
Pt aware that appeal letter has been sent to [place] and they are working on coverage Pt also aware of recs per VS  Pt states that her insurance has still not accepted the appeal letter. Pt states that she is needing the original documents from when coverage was denied which stated that she the patient gave Dr Halford Chessman permission to file an appeal on her behalf. These forms were dropped off/hand delivered to Dr Halford Chessman nurse per the patient. There is nothing scanned into the patients chart other than the letter that was completed 11/03/13.  Pt states that she has not contacted [employer] yet d/t wanting to have all the information available in case [employer] asks for it-- ie: the original denial letter.  Pt states that this was the original so therefore she does not have a copy.  Pt is very frustrated and does not understand what is going on. Patient states that [employer] dropped the ball and nothing has been done yet.  I advised the patient that I would speak with [facility] to see if they can help me with this process as [facility] is telling me that nothing further is needed from our office.

## 2013-11-17 ENCOUNTER — Other Ambulatory Visit: Payer: Self-pay | Admitting: Pulmonary Disease

## 2013-11-21 ENCOUNTER — Telehealth: Payer: Self-pay | Admitting: Pulmonary Disease

## 2013-11-21 MED ORDER — ARMODAFINIL 150 MG PO TABS: ORAL_TABLET | ORAL | Status: DC

## 2013-11-21 NOTE — Telephone Encounter (Signed)
rx has been called to the pts pharmacy and pt is aware. Nothing further is needed.

## 2013-11-21 NOTE — Telephone Encounter (Signed)
Called and spoke with pt and she stated that Dr. Jeral Fruit is going to start filling her rx for the nuvigil.  She stated that it takes Dr. Missy Sabins office about a week to send this in and it will take [facility] a couple of weeks to get this processed and out to her.  Pt is requesting that VS send this in to her local pharmacy for a #30 day supply.  She stated that she is almost out of this medication.  VS please advise if ok to send in a refill of this medication.  Thanks  No Known Allergies  Current Outpatient Prescriptions on File Prior to Visit  Medication Sig Dispense Refill  . Armodafinil (NUVIGIL) 150 MG tablet 1 pill daily as needed.  Do not take after 1:30 pm.  30 tablet  3  . carisoprodol (SOMA) 350 MG tablet Once a day      . Cholecalciferol (HM VITAMIN D3) 4000 UNITS CAPS Take 2,000 Units by mouth. Evening and bedtime      . diclofenac sodium (VOLTAREN) 1 % GEL Apply topically as needed.      . DULoxetine (CYMBALTA) 60 MG capsule Take 60 mg by mouth daily.      Marland Kitchen estazolam (PROSOM) 2 MG tablet Once a day      . FLUoxetine HCl (PROZAC PO) Take by mouth. As directed      . gabapentin (NEURONTIN) 600 MG tablet 3 times daily      . HYDROcodone-acetaminophen (NORCO) 10-325 MG per tablet Take 1 tablet by mouth every 6 (six) hours as needed.      Marland Kitchen HYDROcodone-acetaminophen (NORCO) 7.5-325 MG per tablet Take 1 tablet by mouth every 6 (six) hours as needed.      . lidocaine (LIDODERM) 5 % As needed      . lidocaine (LINDAMANTLE) 3 % CREA cream As needed      . Melatonin 5 MG TABS Take 1 tablet by mouth at bedtime.      Marland Kitchen morphine (KADIAN) 50 MG 24 hr capsule Take 50 mg by mouth at bedtime. Once a day      . morphine (MSIR) 30 MG tablet Take 30 mg by mouth 3 (three) times daily.       Marland Kitchen pyridOXINE (VITAMIN B-6) 100 MG tablet Once a month       No current facility-administered medications on file prior to visit.

## 2013-11-21 NOTE — Telephone Encounter (Signed)
Okay to send script for nuvigil 150 mg, one pill daily prn, dispense 30 pills with no refills.

## 2014-01-23 ENCOUNTER — Telehealth: Payer: Self-pay | Admitting: Pulmonary Disease

## 2014-01-23 DIAGNOSIS — F1190 Opioid use, unspecified, uncomplicated: Principal | ICD-10-CM

## 2014-01-23 DIAGNOSIS — G4737 Central sleep apnea in conditions classified elsewhere: Secondary | ICD-10-CM

## 2014-01-23 NOTE — Telephone Encounter (Signed)
Spoke with pt-- requests a change in DME, someone in [place] for CPAP mask and supplies.  Pt is still in dispute with [employer] and wants to get supplies through another company.  (see phone note 11/12/13)  Please advise Dr Halford Chessman, thanks.

## 2014-01-25 DIAGNOSIS — G4737 Central sleep apnea in conditions classified elsewhere: Secondary | ICD-10-CM | POA: Insufficient documentation

## 2014-01-25 DIAGNOSIS — F1190 Opioid use, unspecified, uncomplicated: Principal | ICD-10-CM

## 2014-01-25 NOTE — Telephone Encounter (Signed)
Order sent.  Nothing further needed.

## 2014-01-25 NOTE — Telephone Encounter (Signed)
Please send new DME order to change to new provider.  She will need [name] with Maximal pressure support 15 cm H2O, minimal pressure support 3 cm H2O, and EPAP 6 cm H2O.  She will need mask of choice.  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

## 2014-02-12 ENCOUNTER — Telehealth: Payer: Self-pay | Admitting: Pulmonary Disease

## 2014-02-12 NOTE — Telephone Encounter (Signed)
LMTCB

## 2014-02-12 NOTE — Telephone Encounter (Signed)
(  continued from above) Pt requests to have all testing, results, ov notes sent to Dr. Brendia Sacks at [facility], ph# [phone], [phone].  Satira Anis

## 2014-02-13 NOTE — Telephone Encounter (Signed)
Spoke with patient, had a few things she is requesting be done. Pt trying to get started back on [name] Device.  (1) Pt requesting to switch from [employer] to [facility] -- order placed 01/25/14, in process of getting new equipment/supplies Pt states that she is going to continue using [employer]'s machine until we ensure that everything is settled and good to go with [facility] and insurance.  (2) Requesting that our office-specifically Dr Halford Chessman- contact [employer] Utilization Management Dept [phone] ([phone]) regarding getting an approval for her [name] device.  Pt states that they are wanting to speak with Dr Halford Chessman to discuss why the she requires [name] device over CPAP/BiPAP. They are requesting all supporting documentation be faxed. Pt states that they should have all the documentation they need on file from the previous appeals claim that was started 2014, so nothing should need to be refaxed.   Please advise Dr Halford Chessman, thanks.    **Pt advised that as for the records she is wanting faxed to Dr Laymond Purser, she will need to sign a Medical Release for all these records to be faxed to her physician.

## 2014-03-06 NOTE — Telephone Encounter (Signed)
I called listed number, spoke with low level representative, advised that he would check with some one about information needed, and placed on hold for > 20 minutes with no one returning to phone.  Please call this number, find out what information is needed.  If a physician peer-to-peer discussion is needed, then please arrange a time/date and let me know.

## 2014-03-06 NOTE — Telephone Encounter (Signed)
Iris with [facility] returning call.  [phone]

## 2014-03-06 NOTE — Telephone Encounter (Signed)
Dr. Sood please advise, thank you.  

## 2014-03-06 NOTE — Telephone Encounter (Signed)
Called [employer] Utilization Management Dept [phone] ([phone]) regarding getting an approval for her [name] device. Pt ID# [id number] Spoke with Denman George regarding getting the patient approved, states that pt cannot be approved at this time base on current claim open, denied 05/2013-filed through [place]. New DME [facility] will have to call and start a new claim. Reason being that since its been more than 180 days since initial claim started, new claim will need to be started.   Total Time: 1mins ------ Called [facility] , LM x 1 to return call ( at lunch)  Will need a Peer-to-Peer once [facility] starts new claim and they "deny" coverage again.

## 2014-03-06 NOTE — Telephone Encounter (Signed)
Iris on a call with another patient Left message w/ Levada Dy to have her call the office back

## 2014-03-12 NOTE — Telephone Encounter (Signed)
Will forward to [place] per her request.

## 2014-03-15 NOTE — Telephone Encounter (Signed)
[facility] has this been taken care of?  Thanks!

## 2014-03-18 NOTE — Telephone Encounter (Signed)
Noted  

## 2014-03-18 NOTE — Telephone Encounter (Signed)
Forwarding to VS as fyi.

## 2014-03-18 NOTE — Telephone Encounter (Signed)
This pt is in the middle of a law suit with [employer] and no dme will take her as of now i don't think we can do anything else at this point Joellen Jersey

## 2014-03-21 ENCOUNTER — Other Ambulatory Visit: Payer: Self-pay | Admitting: Obstetrics and Gynecology

## 2014-03-21 DIAGNOSIS — N6325 Unspecified lump in the left breast, overlapping quadrants: Secondary | ICD-10-CM

## 2014-03-21 DIAGNOSIS — N6320 Unspecified lump in the left breast, unspecified quadrant: Principal | ICD-10-CM

## 2014-03-26 ENCOUNTER — Telehealth: Payer: Self-pay | Admitting: Pulmonary Disease

## 2014-03-26 NOTE — Telephone Encounter (Signed)
Appt scheduled with TP 04/01/14 at 245pm for CPAP compliance -- pt was advised last OV 04/2013 to follow up in 6 months, no appt made at that time.  Pt needs order for new mask. Pt states that she has not heard from [facility] regarding her new machine and mask. Order was placed 01/25/14 and records faxed to [place] ( finalized on our end). Advised the patient that I would contact [facility] and speak with Maudie Mercury regarding this issue and figure out what is going on. Pt is not happy that its been 2 months and she has no idea what is going on. ---- Called and spoke with Maudie Mercury [facility]- informed by Maudie Mercury that they are not wanting to take this patient on as a customer. States that they have not expressed this to the patient. The patient was made aware that order chart hsa been "closed" and that she needed to contact our office to discuss why.  Letter of denial faxed to our office ATTN: Libby/Vineet Sood stating reason on 01/29/14 I asked Maudie Mercury why they have not explained this to the patient and why the patient has been left in the dark for 2 months regarding this issue and Maudie Mercury states that they faxed a latter for our office to be made aware. I advised Maudie Mercury that this is their patient and they need to explain why they have not contacted her regarding this issue and why this order was never taken care of because this is their responsibility. Maudie Mercury stated that she would contact the patient but that the patient would be informed that we received a letter and was aware of this issue and have not done anything. I advised Maudie Mercury that I did not agree with this and that this should not be our responsibility to explain to the patient. Renold Don that I would speak with [facility] our [facility] team lead and call her back.

## 2014-03-26 NOTE — Telephone Encounter (Signed)
Spoke with [facility]--states that no DME will take the patient on until Dispute with [employer] and [employer] is settled. Pt will need to have a handwritten Rx for CPAP, mask and supplies given to her and she will have to purchase her machine and supplies through online services, ex: [employer] [facility] spoke with [employer] and they could not disclose much information d/t the suit the patient has filed against them. Their advice to our office is to have the patient purchase her supplies through an outside company for now ([email or web address]). Pt is not understanding why she is not able to get supplies through a DME and is not understanding why she is having to get her CPAP supplies and masks through an outside company and pay out of pocket when she has insurance. Pt started asking a lot of questions that I could not answer, I advised the patient that she could speak with our [facility] to discuss in more detail. Telephone call transferred to [facility]-- will send message to her to document.

## 2014-03-26 NOTE — Telephone Encounter (Signed)
lmtcb for pt. Pt is needing a f/u visit.

## 2014-03-26 NOTE — Telephone Encounter (Signed)
Pt returning call.Regina Long ° °

## 2014-03-28 NOTE — Telephone Encounter (Signed)
This has all been resolved [facility] will take pt and get the supplies needed Joellen Jersey

## 2014-04-01 ENCOUNTER — Ambulatory Visit (INDEPENDENT_AMBULATORY_CARE_PROVIDER_SITE_OTHER): Payer: BC Managed Care – PPO | Admitting: Adult Health

## 2014-04-01 ENCOUNTER — Encounter: Payer: Self-pay | Admitting: Adult Health

## 2014-04-01 VITALS — BP 126/70 | HR 85 | Temp 98.2°F

## 2014-04-01 DIAGNOSIS — G4737 Central sleep apnea in conditions classified elsewhere: Secondary | ICD-10-CM

## 2014-04-01 DIAGNOSIS — F1190 Opioid use, unspecified, uncomplicated: Principal | ICD-10-CM

## 2014-04-01 NOTE — Assessment & Plan Note (Signed)
Controlled on [name] w/ good compliance   Plan  Continue on VPAP /Sleep machine At bedtime   Keep up good work.  Follow up in 1 year and As needed

## 2014-04-01 NOTE — Progress Notes (Signed)
Subjective:    Patient ID: Regina Long, female    DOB: [birth year], 38 y.o.   MRN: [id number]  HPI 38 yo female with complex sleep apnea.  04/01/2014 Follow up Sleep Disorder  She is on [name]  Says she sleeps well with machine, Download shows good compliance with avg 7hr use each night.   She continues to be  Sleepy during daytime. Nelta Numbers says she takes about 2 x week that helps some.  She is more rested than usual.  She is on chronic narcotics w/  Vicodin , MSIR and Kadian   Most sleepiness is late in evening and right before bedtime .   Recently changed DME company, to [facility]   TESTS: PSG 12/05/11 >> AHI 16.4, SpO2 low 86% >> Central apnea index 11.1, Obstructive apnea index 1.2, Mixed apnea index 0.2, Hypopnea index 3.9. This is consistent with central sleep apnea.  ONO with CPAP and RA 05/01/12 >> Test time 6 hrs 19 min. Mean SpO2 97%, low SpO2 79%. Spent 12 sec with SpO2 < 88%. Supplemental oxygen not indicated.  05/02/12 CPAP mask desensitization [name] Titration 08/17/12 >> AHI 0 with Max EPAP 15 cm H2), Min EPAP 6 cm H20 and Max PS 15 cm H2O and Min PS 3 cm H2O. Fitted with [employer]Med airfit 10 small size full face mask. [name] 03/19/13 to 04/17/13 >> Used on 30 of 30 nights with average 6 hrs 48 min.  Average AHI 0.4 with Max PS 15 cm H2O, Min PS 3 H2O, EPAP 6 cm H2O.      Review of Systems Constitutional:   No  weight loss, night sweats,  Fevers, chills,  +fatigue, or  lassitude.  HEENT:   No headaches,  Difficulty swallowing,  Tooth/dental problems, or  Sore throat,                No sneezing, itching, ear ache, nasal congestion, post nasal drip,   CV:  No chest pain,  Orthopnea, PND, swelling in lower extremities, anasarca, dizziness, palpitations, syncope.   GI  No heartburn, indigestion, abdominal pain, nausea, vomiting, diarrhea, change in bowel habits, loss of appetite, bloody stools.   Resp: No shortness of breath with exertion or at rest.  No excess mucus, no  productive cough,  No non-productive cough,  No coughing up of blood.  No change in color of mucus.  No wheezing.  No chest wall deformity  Skin: no rash or lesions.  GU: no dysuria, change in color of urine, no urgency or frequency.  No flank pain, no hematuria   MS:  Chronic FM pain in muscles on chronic narcotics   Psych:  No change in mood or affect. No depression or anxiety.  No memory loss.         Objective:   Physical Exam GEN: A/Ox3; pleasant , NAD, well nourished   HEENT:  [place]/AT,  EACs-clear, TMs-wnl, NOSE-clear, THROAT-clear, no lesions, no postnasal drip or exudate noted.   NECK:  Supple w/ fair ROM; no JVD; normal carotid impulses w/o bruits; no thyromegaly or nodules palpated; no lymphadenopathy.  RESP  Clear  P & A; w/o, wheezes/ rales/ or rhonchi.no accessory muscle use, no dullness to percussion  CARD:  RRR, no m/r/g  , no peripheral edema, pulses intact, no cyanosis or clubbing.  GI:   Soft & nt; nml bowel sounds; no organomegaly or masses detected.  Musco: Warm bil, no deformities or joint swelling noted.   Neuro: alert, no focal deficits  noted.    Skin: Warm, no lesions or rashes         Assessment & Plan:

## 2014-04-01 NOTE — Patient Instructions (Addendum)
Continue on VPAP /Sleep machine At bedtime   Keep up good work.  Follow up in 1 year and As needed

## 2014-04-02 ENCOUNTER — Ambulatory Visit
Admission: RE | Admit: 2014-04-02 | Discharge: 2014-04-02 | Disposition: A | Discharge status: Home / Self Care | Payer: BC Managed Care – PPO | Source: Ambulatory Visit | Attending: Obstetrics and Gynecology | Admitting: Obstetrics and Gynecology

## 2014-04-02 ENCOUNTER — Other Ambulatory Visit: Payer: Self-pay | Admitting: Obstetrics and Gynecology

## 2014-04-02 ENCOUNTER — Other Ambulatory Visit: Payer: BC Managed Care – PPO

## 2014-04-02 DIAGNOSIS — N6325 Unspecified lump in the left breast, overlapping quadrants: Secondary | ICD-10-CM

## 2014-04-02 DIAGNOSIS — N6310 Unspecified lump in the right breast, unspecified quadrant: Secondary | ICD-10-CM

## 2014-04-02 DIAGNOSIS — N6320 Unspecified lump in the left breast, unspecified quadrant: Principal | ICD-10-CM

## 2014-04-02 NOTE — Progress Notes (Signed)
Reviewed and agree with assessment/plan. 

## 2014-04-03 ENCOUNTER — Other Ambulatory Visit: Payer: BC Managed Care – PPO

## 2014-08-15 ENCOUNTER — Telehealth: Payer: Self-pay | Admitting: Pulmonary Disease

## 2014-08-15 DIAGNOSIS — G4731 Primary central sleep apnea: Secondary | ICD-10-CM

## 2014-08-15 NOTE — Telephone Encounter (Signed)
Please send order to have her DME assess her mask fit.

## 2014-08-15 NOTE — Telephone Encounter (Signed)
Called and spoke to pt. Informed pt of the recs per VS. Order placed. Pt verbalized understanding and denied any further questions or concerns at this time.

## 2014-08-15 NOTE — Telephone Encounter (Signed)
Called and spoke to pt. Pt c/o mask discomfort and feels she cannot wear the mask as long as she use to d/t discomfort. Pt stated she is snoring again-per her husband. Pt is requesting a new mask fitting.   VS please advise.

## 2014-08-27 ENCOUNTER — Telehealth: Payer: Self-pay | Admitting: Pulmonary Disease

## 2014-08-27 DIAGNOSIS — G4733 Obstructive sleep apnea (adult) (pediatric): Secondary | ICD-10-CM

## 2014-08-27 NOTE — Telephone Encounter (Signed)
Pt had mask fit with [facility] and the mask they fit her with is not working for her.  Pt states that [facility] only had 1 mask for her to try and it was almost too small.  Pt states that nose is roughed up and head and tender from where headgear was sitting.  Pt is wanting to have another mask fit done  Dr Halford Chessman please advise if you are okay with me ordering a mask fit at [facility]. Thanks.

## 2014-08-28 NOTE — Telephone Encounter (Signed)
Okay to send order.  Please also ask her to review web sites for [employer], [employer], and [employer].  She can look up different mask options on line and let me know if there is one in particular she would like to try.

## 2014-08-28 NOTE — Telephone Encounter (Signed)
Spoke with patient, aware of rec's per VS Order placed for mask fitting.  Nothing further needed.

## 2014-09-09 ENCOUNTER — Ambulatory Visit (HOSPITAL_BASED_OUTPATIENT_CLINIC_OR_DEPARTMENT_OTHER): Payer: Self-pay | Attending: Pulmonary Disease | Admitting: Radiology

## 2014-09-09 DIAGNOSIS — G4733 Obstructive sleep apnea (adult) (pediatric): Secondary | ICD-10-CM

## 2014-09-09 DIAGNOSIS — G4731 Primary central sleep apnea: Secondary | ICD-10-CM

## 2014-10-04 ENCOUNTER — Other Ambulatory Visit: Payer: Self-pay | Admitting: Pulmonary Disease

## 2014-10-04 NOTE — Telephone Encounter (Signed)
Pt is requesting a refill on Nuvigil 150mg .  Last refilled 12/01/13 #30 with 0 refills.   Last ov 04/01/14 with TP. Dr. Halford Chessman are you ok with this refill?  Thanks!

## 2014-10-11 MED ORDER — ARMODAFINIL 150 MG PO TABS: ORAL_TABLET | ORAL | Status: DC

## 2014-10-11 NOTE — Telephone Encounter (Signed)
Okay to send script with 5 refills.

## 2014-10-24 ENCOUNTER — Encounter: Payer: Self-pay | Admitting: Pulmonary Disease

## 2014-11-04 ENCOUNTER — Other Ambulatory Visit: Payer: Self-pay | Admitting: Pulmonary Disease

## 2014-11-06 ENCOUNTER — Telehealth: Payer: Self-pay | Admitting: *Deleted

## 2014-11-06 NOTE — Telephone Encounter (Signed)
PA processed for Nuvigil 150 mg Received [facility] from [facility]. Pt id on PA request did not match what we have on file. Called patient to get new ins information. Pt now has [facility] ID # [id number] BIN # [id number] Rx group [id number] Processed via covermymeds [facility] (non-Medicare).

## 2014-11-07 NOTE — Telephone Encounter (Signed)
Received additional form from patient's ins that needed completing and Dr. Juanetta Gosling signature. Completed form and faxed to [place] office Attn: Varney Daily.

## 2014-11-12 NOTE — Telephone Encounter (Signed)
Form is in Dr Collie Siad look at folder to sign. Will send to Dr Halford Chessman as Juluis Rainier

## 2014-11-12 NOTE — Telephone Encounter (Signed)
Ashytn has this been done yet.

## 2014-11-13 NOTE — Telephone Encounter (Signed)
Armodafinil has been denied. Current plan does not allow coverage if it is unknown if the patient has dx of narcolepsy, osa, or SWD confirmed by sleep lab eval. Was a copy of sleep study faxed along with form?

## 2014-11-13 NOTE — Telephone Encounter (Signed)
Spoke with Davy Pique- states that she has discarded this information Called [facility], spoke with Tillie Rung regarding PA Completed PA over the phone.  PA approved x 12 month period PA Confirmation # [id number] ----- Called patient, made aware that Regina Long has been approved.  Pt to contact our office if anything further needed.   Will send to Dr Halford Chessman to make aware that his signature is not needed.

## 2014-11-13 NOTE — Telephone Encounter (Signed)
Noted  

## 2014-11-13 NOTE — Telephone Encounter (Signed)
Dr Halford Chessman has not even signed this form yet--it is in his look at to be signed and message has been sent to him to make him aware that his signature is needed 11/12/14.  When form is signed by VS, a copy of the sleep study will be attached and faxed with it. Will sent back to [facility] to clarify where she received coverage determination information as I have not received anything stating a denial.

## 2015-02-04 ENCOUNTER — Telehealth: Payer: Self-pay | Admitting: Pulmonary Disease

## 2015-02-04 NOTE — Telephone Encounter (Signed)
Called and spoke with pt Pt stated that she attempted to order her CPAP supplies and was told she needed a face to face done per insurance Pt stated that her husband is not currently working and she does not have the money to come in for an office visit Pt was wondering if VS could help with ordering supplies since she is unable to come in for OV at this time Pt is hoping that she will be able to come for OV at the beginning of 2017. Informed pt that VS was out of office this afternoon and would not address message until tomorrow Pt states that is fine because she was told that she can not order supplies until after 10/26  Dr Halford Chessman, please advise. Thanks

## 2015-02-04 NOTE — Telephone Encounter (Signed)
LMTC x 1  

## 2015-02-05 NOTE — Telephone Encounter (Signed)
lmtcb x1 for pt. 

## 2015-02-05 NOTE — Telephone Encounter (Signed)
Spoke with pt, scheduled for Monday at 9:00-doublebook ok per VS.   Spoke with chan on how to defer charges for this visit.  Per [name], Dr. Halford Chessman would have to no-charge this visit.    Forwarding to VS to make aware.  Nothing further needed.

## 2015-02-05 NOTE — Telephone Encounter (Signed)
Noted  

## 2015-02-05 NOTE — Telephone Encounter (Signed)
Can order supplies for her [name].  Can schedule her for ROV with me and defer charge for this visit >> okay to double book visit.

## 2015-02-10 ENCOUNTER — Ambulatory Visit (INDEPENDENT_AMBULATORY_CARE_PROVIDER_SITE_OTHER): Payer: Medicare Other | Admitting: Pulmonary Disease

## 2015-02-10 ENCOUNTER — Encounter: Payer: Self-pay | Admitting: Pulmonary Disease

## 2015-02-10 VITALS — BP 122/84 | HR 89 | Temp 98.1°F | Ht 64.0 in | Wt 148.8 lb

## 2015-02-10 DIAGNOSIS — G4719 Other hypersomnia: Secondary | ICD-10-CM

## 2015-02-10 DIAGNOSIS — G4737 Central sleep apnea in conditions classified elsewhere: Secondary | ICD-10-CM

## 2015-02-10 DIAGNOSIS — G4731 Primary central sleep apnea: Secondary | ICD-10-CM

## 2015-02-10 DIAGNOSIS — F1190 Opioid use, unspecified, uncomplicated: Secondary | ICD-10-CM

## 2015-02-10 DIAGNOSIS — G4733 Obstructive sleep apnea (adult) (pediatric): Secondary | ICD-10-CM

## 2015-02-10 NOTE — Progress Notes (Signed)
Chief Complaint  Patient presents with  . Follow-up    Pt following for OSA: pt states the struggle is falling asleep before she gets to the CPAP. pt using everynight for about 5 hours. pt states she needs new supplies, she recently switched to medicare. DME: [facility]    CC: Regina Long [phone])  History of Present Illness: Regina Long is a 39 y.o. 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  She has been using [name] every night.  This helps.  She still has daytime sleepiness.  She uses nuvigil sporadically >> limited by cost.  She has been using phenteramine for weight loss, and this helps keep her awake also.  TESTS: PSG 12/05/11 >> AHI 16.4, SpO2 low 86% >> Central apnea index 11.1, Obstructive apnea index 1.2, Mixed apnea index 0.2, Hypopnea index 3.9. This is consistent with central sleep apnea.  ONO with CPAP and RA 05/01/12 >> Test time 6 hrs 19 min. Mean SpO2 97%, low SpO2 79%. Spent 12 sec with SpO2 < 88%. Supplemental oxygen not indicated.  05/02/12 CPAP mask desensitization [name] 11/11/14 to 02/08/15 >> used on 90 of 90 nights with average 7 hrs 5 min.  Average AHI 0.4 with Max PS 15 cm H2O, min PS 3 cm H2O, EPAP 6 cm H2O  PMHx >> HA, Fibromyalgia, IBS, DJD, Memory deficit  PSHx, Medications, Allergies, Fhx, Shx reviewed.  Physical Exam: BP 122/84 mmHg  Pulse 89  Temp(Src) 98.1 F (36.7 C) (Oral)  Ht 5\' 4"  (1.626 m)  Wt 148 lb 12.8 oz (67.495 kg)  BMI 25.53 kg/m2  SpO2 99%  General - No distress ENT - No sinus tenderness, no oral exudate, no LAN Cardiac - s1s2 regular, no murmur Chest - No wheeze/rales/dullness Back - No focal tenderness Abd - Soft, non-tender Ext - No edema Neuro - Normal strength Skin - No rashes Psych - normal mood, and behavior   Assessment/Plan:  Complex sleep apnea. She is compliant with therapy and reports benefit. Plan: - continue [name] - will arrange for new supplies  Daytime hypersomnolence. Related to complex sleep apnea, and her multiple  medications. Plan: - continue nuvigil prn   Chesley Mires, MD [facility] Pager:  [phone] 02/10/2015, 10:18 AM

## 2015-02-10 NOTE — Patient Instructions (Signed)
Follow up in 1 year.

## 2015-04-26 ENCOUNTER — Other Ambulatory Visit: Payer: Self-pay | Admitting: Pulmonary Disease

## 2015-04-28 NOTE — Telephone Encounter (Signed)
Refill request received for Armodafinil. Please advise. Thank you.

## 2015-05-06 ENCOUNTER — Ambulatory Visit: Payer: Self-pay | Admitting: Pulmonary Disease

## 2015-05-06 ENCOUNTER — Other Ambulatory Visit: Payer: Self-pay | Admitting: Pulmonary Disease

## 2015-05-07 NOTE — Telephone Encounter (Signed)
Pt last seen on 02/10/15 with VS  Complex sleep apnea. She is compliant with therapy and reports benefit. Plan: - continue [name] - will arrange for new supplies  Daytime hypersomnolence. Related to complex sleep apnea, and her multiple medications. Plan: - continue nuvigil prn  Received electronic refill request for nuvigil

## 2015-05-15 ENCOUNTER — Telehealth: Payer: Self-pay | Admitting: Pulmonary Disease

## 2015-05-15 MED ORDER — ARMODAFINIL 150 MG PO TABS: 150.0000 mg | ORAL_TABLET | Freq: Every day | ORAL | Status: DC | PRN

## 2015-05-15 NOTE — Telephone Encounter (Signed)
According to pt's record, VS approved this on 04/28/15. Rx was "printed" instead of being called in. Pt is aware that we will get his prescription called in for her. Nothing further was needed.

## 2015-05-26 ENCOUNTER — Telehealth: Payer: Self-pay | Admitting: Pulmonary Disease

## 2015-05-26 NOTE — Telephone Encounter (Signed)
Spoke with Judson Roch at [place] her that we were unaware that this needed a PA. [employer]'s number is [phone]. PA has been initiated through [facility]. Key: [id number].  The request has received a Pending outcome. Please note any additional information provided by [facility] at the bottom of your screen. You will receive a final determination electronically in CoverMyMeds and via email and fax within 24 to 72 hours.

## 2015-05-27 NOTE — Telephone Encounter (Signed)
Checked status of PA on CMM.  This request has received a Favorable outcome. You will also receive a faxed copy of the determination.  Will await PA decision.

## 2015-05-28 NOTE — Telephone Encounter (Signed)
Received fax that Rockie Neighbours has been approved until 05/25/2017. Pharmacy informed.

## 2015-05-28 NOTE — Telephone Encounter (Signed)
Awaiting PA decision

## 2015-06-30 ENCOUNTER — Telehealth: Payer: Self-pay | Admitting: Pulmonary Disease

## 2015-06-30 NOTE — Telephone Encounter (Signed)
LM for pt x 1  

## 2015-07-01 NOTE — Telephone Encounter (Signed)
lmtcb x2 for pt. 

## 2015-07-02 NOTE — Telephone Encounter (Signed)
LVM for pt to return call

## 2015-07-02 NOTE — Telephone Encounter (Signed)
Spoke with pt, states that Kim from [place] was supposed to bring a form yesterday for VS to sign and fax back to [facility] for cpap supplies.  Pt states she is past due for her cpap supplies and would like confirmation on when this is faxed back.  A voicemail on below verified number will be fine per pt.  Ashtyn do you have this form, or does it need to be re-requested from [facility]?  Thanks!

## 2015-07-02 NOTE — Telephone Encounter (Signed)
LMTCB

## 2015-07-02 NOTE — Telephone Encounter (Signed)
Pt returned call - [phone]. Kim from [facility] was to come by yesterday afternoon. Did she & did we complete form?

## 2015-07-03 NOTE — Telephone Encounter (Signed)
Spoke with [facility]- no order forms have been received yet.  Called [facility] , LM for Kim to return call x 1

## 2015-07-04 NOTE — Telephone Encounter (Signed)
Called and spoke with Regina Long. She states that everything was received and nothing further is needed at this time.

## 2015-10-28 ENCOUNTER — Telehealth: Payer: Self-pay | Admitting: Pulmonary Disease

## 2015-10-28 NOTE — Telephone Encounter (Signed)
Per pt---[facility] sent a form to [place] the first of July and they have a hold on her account until the doctor signs off on this form.  Pt is needing to order supplies and she is not able to do this at this time.   Regina Long i called [facility] and they stated that this form is a CMN that was sent over on June 30.

## 2015-10-29 NOTE — Telephone Encounter (Addendum)
Message closed in error.  Regina Long please advise if you have a CMN on this patient. Please reply straight to me so that I can follow this message. Thanks.

## 2015-10-30 NOTE — Telephone Encounter (Signed)
The order was faxed to [place] on 07/03 and is in Dr. Juanetta Gosling folder for him to sign when he returns to office

## 2015-10-31 NOTE — Telephone Encounter (Signed)
CMN is in Dr Juanetta Gosling "CMN" folder to be delivered to him next week while in office.  Will hold in my box to follow up

## 2015-11-04 ENCOUNTER — Telehealth: Payer: Self-pay | Admitting: *Deleted

## 2015-11-04 NOTE — Telephone Encounter (Signed)
CMN signed and given back to [place]

## 2015-11-04 NOTE — Telephone Encounter (Signed)
Received fax from [facility] stating pt is requesting a PA for brand name nuvigil. She does not feel generic works. Help desk [phone] ID# [id number] with Valeria-PA rep. PA was approved for brand name Nuvigil. PA # [id number]  Called [facility] and made aware. Nothing further needed

## 2015-11-20 LAB — HM MAMMOGRAPHY: HM MAMMO: NORMAL (ref 0–4)

## 2015-11-20 LAB — HM PAP SMEAR

## 2015-11-21 ENCOUNTER — Telehealth: Payer: Self-pay | Admitting: Pulmonary Disease

## 2015-11-21 ENCOUNTER — Other Ambulatory Visit: Payer: Self-pay | Admitting: Pulmonary Disease

## 2015-11-21 MED ORDER — ARMODAFINIL 150 MG PO TABS: 150.0000 mg | ORAL_TABLET | Freq: Every day | ORAL | 5 refills | Status: DC | PRN

## 2015-11-21 NOTE — Telephone Encounter (Signed)
Pt last seen 10.24.16 by VS for yearly sleep follow up  Recall is in the system for the Oct 2017 appt  Called spoke with patient and verified pharmacy, medication name and dose that she is needing refilled.  Also scheduled for her Oct appt with VS >> 10.24.17 @ 2pm.  [facility] to [facility] [address] > spoke with pharmacist Merrilee Seashore.  Med list updated.  Pt would like to discuss changing the Armodanifil to another similar medication at her upcoming ov because she fills this medication is not very effective but she is okay with waiting until her appt to discuss changing.  Will sign and forward to VS as FYI.  Instructions     Return in about 1 year (around 02/10/2016).  Follow up in 1 year

## 2015-11-26 NOTE — Telephone Encounter (Signed)
Noted  

## 2016-01-29 ENCOUNTER — Other Ambulatory Visit: Payer: Self-pay | Admitting: Obstetrics and Gynecology

## 2016-01-29 NOTE — H&P (Signed)
40 y.o. yo complains of severe, intense and unrelenting R&LLQ pain, pelvic pain presumably from endometriosis.  Previous notes: "[place] today; Pt. c/o constant stabbing abdominal/pelvic pain with burning sensation, has hx of endometriosis and has Mirena IUD, started 3 months ago, denies any bleeding other than cycles; denies N/V/ tb//  Pain could be from endometriosis or from ov since does have some ff in cul de sac.  Pelvic TV and 3D- IUD in place, no other abnormalities except for slight ff in cul de sac. Had lupron in way past- caused hot flashes. On Duloxetine and anxiolytic.  D/w pt Depo Provera but worried about weight gain."  "Intense pain still- offered Lupron, Prometrium 200 mg for 6 months or surgery- robo dx or robo TLH. Pain is exactly like previous when had significant endometriosis. Had a long honeymoon period after had baby. Bleeding is being taken care of by IUD but not the pain. Long discussion with pt about hyst and possible removal of ovaries, ablation or cautery of endometriosis during surgery to decrease disease burden and expectations including possibility that pain will not immediately go away. D/w pt in detail menopausal symptoms and possibility of taking off and putting back on estrogen to help with sx of endometriosis vs sx of menopausal sx.  I spent at least 30 minutes face to face with patient and instructed her on expected course of therapy, diagnosis and side effects. Pt voiced understanding. Info given on TLH. Offered prometrium in mean time."     Past Medical History:  Diagnosis Date  . Arthritis   . Chronic headache   . Complex sleep apnea syndrome   . DJD (degenerative joint disease)   . Fibromyalgia   . IBS (irritable bowel syndrome)   . Short-term memory loss    Past Surgical History:  Procedure Laterality Date  . 3 laproscopies     endometriosis  . TUBAL LIGATION      Social History   Social History  . Marital status: Married    Spouse name: N/A  .  Number of children: 2  . Years of education: N/A   Occupational History  . disabled    Social History Main Topics  . Smoking status: Former Smoker    Years: 3.00    Quit date: 04/19/2004  . Smokeless tobacco: Not on file     Comment: <1/2 ppd  . Alcohol use No  . Drug use: No  . Sexual activity: Not on file   Other Topics Concern  . Not on file   Social History Narrative  . No narrative on file    No current facility-administered medications on file prior to encounter.    Current Outpatient Prescriptions on File Prior to Encounter  Medication Sig Dispense Refill  . carisoprodol (SOMA) 350 MG tablet Take 350 mg by mouth at bedtime.     . DULoxetine (CYMBALTA) 60 MG capsule Take 60 mg by mouth at bedtime.     Marland Kitchen estazolam (PROSOM) 2 MG tablet Take 1-2 mg by mouth at bedtime.     . gabapentin (NEURONTIN) 600 MG tablet Take 600 mg by mouth 3 (three) times daily.     Marland Kitchen HYDROcodone-acetaminophen (NORCO) 10-325 MG per tablet Take 1 tablet by mouth every 6 (six) hours as needed for moderate pain.     . Methylcobalamin 5000 MCG TBDP Take 1 tablet by mouth daily with lunch.     . pyridOXINE (VITAMIN B-6) 100 MG tablet Take 100 mg by mouth daily.  Allergies  Allergen Reactions  . Ritalin [Methylphenidate Hcl] Other (See Comments)    Pt states that this medication "knocked her out".      @VITALS2 @  Lungs: clear to ascultation Cor:  RRR Abdomen:  soft, nontender, nondistended. Ex:  no cords, erythema Pelvic:   Female Genitalia: Vulva: no masses, atrophy, or lesions. Vagina: no tenderness, erythema, rectocele, abnormal vaginal discharge, or vesicle(s) or ulcers and cystocele (-1). Cervix: no discharge or cervical motion tenderness and grossly normal. Uterus: normal shape and size (7) and midline, non-tender, and no uterine prolapse. Bladder/Urethra: no urethral discharge or mass and bladder non distended and Urethra hypermobile (>45). Adnexa/Parametria: no parametrial tenderness  or mass and no adnexal tenderness or ovarian mass.    A:  Chronic endometriosis with history of multiple laparoscopies in past with confirmation of diagnosis.  Pt is past child bearing and desires definitive.  Extensive discussions with pt (see above) and she desires to proceed with TLH and removal of ovaries.  Pt does have some SUI- at the time of this H&P, she did not desire TVT as well.  Will update H&P if she changes her mind.    P: All risks, benefits and alternatives d/w patient and she desires to proceed.  Patient will undergo a modified bowel prep and will receive preop antibiotics and SCDs during the operation.     Devarious Pavek A

## 2016-02-03 ENCOUNTER — Inpatient Hospital Stay (HOSPITAL_COMMUNITY)
Admission: RE | Admit: 2016-02-03 | Discharge: 2016-02-03 | Disposition: A | Discharge status: Home / Self Care | Payer: Medicare Other | Source: Ambulatory Visit

## 2016-02-05 ENCOUNTER — Telehealth: Payer: Self-pay | Admitting: Pulmonary Disease

## 2016-02-05 NOTE — Telephone Encounter (Signed)
PA initiated for Armodafinil through [facility] Key: [id number]  Approved. Called [facility] and notified them that Rx has been approved. Nothing further needed.

## 2016-02-10 ENCOUNTER — Ambulatory Visit (INDEPENDENT_AMBULATORY_CARE_PROVIDER_SITE_OTHER): Payer: BLUE CROSS/BLUE SHIELD | Admitting: Pulmonary Disease

## 2016-02-10 ENCOUNTER — Telehealth: Payer: Self-pay | Admitting: Internal Medicine

## 2016-02-10 ENCOUNTER — Encounter: Payer: Self-pay | Admitting: Pulmonary Disease

## 2016-02-10 VITALS — BP 128/78 | HR 93 | Ht 64.75 in | Wt 158.8 lb

## 2016-02-10 DIAGNOSIS — G4731 Primary central sleep apnea: Secondary | ICD-10-CM

## 2016-02-10 DIAGNOSIS — G4719 Other hypersomnia: Secondary | ICD-10-CM | POA: Diagnosis not present

## 2016-02-10 DIAGNOSIS — Z0000 Encounter for general adult medical examination without abnormal findings: Secondary | ICD-10-CM

## 2016-02-10 NOTE — Patient Instructions (Addendum)
Will arrange for referral to new primary care provider  Call one week before you run out of phentermine  Follow up in 6 months

## 2016-02-10 NOTE — Telephone Encounter (Signed)
Would you be able to take patient on? °

## 2016-02-10 NOTE — Progress Notes (Signed)
Current Outpatient Prescriptions on File Prior to Visit  Medication Sig  . Armodafinil (NUVIGIL) 150 MG tablet Take 150 mg by mouth daily.   . Calcium Carbonate-Vitamin D (CALCIUM 600+D) 600-400 MG-UNIT tablet Take 1 tablet by mouth 2 (two) times daily.  . carisoprodol (SOMA) 350 MG tablet Take 350 mg by mouth at bedtime.   . cholecalciferol (VITAMIN D) 1000 units tablet Take 2,000 Units by mouth 2 (two) times daily.  . DULoxetine (CYMBALTA) 60 MG capsule Take 60 mg by mouth at bedtime.   Regina Long Kitchen estazolam (PROSOM) 2 MG tablet Take 0.5-1 mg by mouth at bedtime.   . gabapentin (NEURONTIN) 600 MG tablet Take 600 mg by mouth 3 (three) times daily.   Regina Long Kitchen HYDROcodone-acetaminophen (NORCO) 10-325 MG per tablet Take 1 tablet by mouth every 6 (six) hours as needed for moderate pain.   . Levomefolate Glucosamine (METHYLFOLATE PO) Take 1 tablet by mouth every evening.  . Methylcobalamin 5000 MCG TBDP Take 1 tablet by mouth daily with lunch.   . morphine (MS CONTIN) 30 MG 12 hr tablet Take 30 mg by mouth 3 (three) times daily.  Regina Long Kitchen morphine (MS CONTIN) 60 MG 12 hr tablet Take 60 mg by mouth at bedtime.  . progesterone (PROMETRIUM) 200 MG capsule Take 200 mg by mouth daily with lunch.  . pyridOXINE (VITAMIN B-6) 100 MG tablet Take 100 mg by mouth daily.    No current facility-administered medications on file prior to visit.      Chief Complaint  Patient presents with  . Follow-up    Wears CPAP most nights. Pt states that she falls asleep before she is able to make it to bed. Pt states that if she sits down and becomes inactive she will fall asleep (watching tv, on the toilet, laying with kids). DME: [facility]; Discuss taking Phentermine instead of Nuvigil.     Sleep tests PSG 12/05/11 >> AHI 16.4, SpO2 low 86% >> Central apnea index 11.1, Obstructive apnea index 1.2, Mixed apnea index 0.2, Hypopnea index 3.9. This is consistent with central sleep apnea.  ONO with CPAP and RA 05/01/12 >> Test time 6 hrs 19 min. Mean  SpO2 97%, low SpO2 79%. Spent 12 sec with SpO2 < 88%. Supplemental oxygen not indicated.  05/02/12 CPAP mask desensitization [name] 11/12/15 to 02/09/16 >> used on 82 of 90 nights with average 4 hrs 4 min.  Average AHI 1.7 with median pressure 10 and 95 th percentile pressure 14 cm H2O  Past medical history HA, Fibromyalgia, IBS, DJD, Memory deficit  Past surgical history, Family history, Social history, Allergies all reviewed.  Vital Signs BP 128/78 (BP Location: Left Arm, Cuff Size: Normal)   Pulse 93   Ht 5' 4.75" (1.645 m)   Wt 158 lb 12.8 oz (72 kg)   SpO2 98%   BMI 26.63 kg/m   History of Present Illness Regina Long is a 40 y.o. 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  She continues to have trouble with her memory and focus.  She is not able to keep up with her activities at home.  She has to force herself to stay busy.  If she is not busy, then she falls asleep.    Dr. Laymond Purser is retiring.  She is very distraught about this.  She needs to find a new PCP.  She was using nuvigil and this helped, but too expensive.  She switched to generic armodafinil, but this doesn't work as well and is still expensive.  Dr. Laymond Purser prescribed  phentermine and this has helped.  She uses VPAP when ever she doesn't fall asleep before putting mask on.  This helps tremendously, and she feels much better when she gets full night sleep with VPAP.    She is scheduled to see Dr. Sabra Heck with neurology.  She will be having hysterectomy for endometriosis in November.  Physical Exam  General - No distress ENT - No sinus tenderness, no oral exudate, no LAN Cardiac - s1s2 regular, no murmur Chest - No wheeze/rales/dullness Back - No focal tenderness Abd - Soft, non-tender Ext - No edema Neuro - Normal strength Skin - No rashes Psych - normal mood, and behavior   Assessment/Plan  Complex sleep apnea. - she is compliant with therapy and reports benefit - continue VPAP  Persistent daytime sleepiness. -  advised her to contact our office one week prior to running out of phentermine and then I can send in refill  Health maintenance. - will try to arrange for new PCP in [place]  Memory difficulties. - she is scheduled for appointment with neurology >> advised her to keep this appointment.   Patient Instructions  Will arrange for referral to new primary care provider  Call one week before you run out of phentermine  Follow up in 6 months     Chesley Mires, MD [place] Pager:  [phone] 02/10/2016, 5:18 PM

## 2016-02-11 ENCOUNTER — Encounter (HOSPITAL_COMMUNITY): Admission: RE | Payer: Self-pay | Source: Ambulatory Visit

## 2016-02-11 ENCOUNTER — Ambulatory Visit (HOSPITAL_COMMUNITY)
Admission: RE | Admit: 2016-02-11 | Payer: BLUE CROSS/BLUE SHIELD | Source: Ambulatory Visit | Admitting: Obstetrics and Gynecology

## 2016-02-11 SURGERY — ROBOTIC ASSISTED TOTAL HYSTERECTOMY WITH BILATERAL SALPINGO OOPHORECTOMY
Anesthesia: Choice

## 2016-02-11 NOTE — Telephone Encounter (Signed)
YES

## 2016-02-11 NOTE — Telephone Encounter (Signed)
Got patient scheduled

## 2016-02-17 ENCOUNTER — Ambulatory Visit (INDEPENDENT_AMBULATORY_CARE_PROVIDER_SITE_OTHER): Payer: BLUE CROSS/BLUE SHIELD | Admitting: Internal Medicine

## 2016-02-17 ENCOUNTER — Encounter: Payer: Self-pay | Admitting: Internal Medicine

## 2016-02-17 VITALS — BP 124/88 | HR 89 | Temp 98.5°F | Resp 16 | Ht 64.75 in | Wt 156.0 lb

## 2016-02-17 DIAGNOSIS — E559 Vitamin D deficiency, unspecified: Secondary | ICD-10-CM

## 2016-02-17 DIAGNOSIS — M797 Fibromyalgia: Secondary | ICD-10-CM | POA: Diagnosis not present

## 2016-02-17 NOTE — Progress Notes (Signed)
Pre visit review using our clinic review tool, if applicable. No additional management support is needed unless otherwise documented below in the visit note. 

## 2016-02-17 NOTE — Progress Notes (Signed)
Subjective:  Patient ID: Regina Long, female    DOB: [birth year]  Age: 40 y.o. MRN: [id number]  CC: Fibromyalgia  NEW TO ME  HPI BRITTEN TAUBERT presents for establishing with a new primary care physician. She had been seeing someone in [facility] but she wants to make a change. She is disabled after a 10 year history of debilitating memory loss, chronic fatigue syndrome, and fibromyalgia. She has been treated by a specialist out of [place] and has been maintained on gabapentin, hydrocodone, morphine, and Soma. She tells me that she has been stable lately and is managing well on this combination of medications. She tells me her last complete physical was in December 2016. She has no medical records with her today.  Outpatient Medications Prior to Visit  Medication Sig Dispense Refill  . Armodafinil (NUVIGIL) 150 MG tablet Take 150 mg by mouth daily.     . Calcium Carbonate-Vitamin D (CALCIUM 600+D) 600-400 MG-UNIT tablet Take 1 tablet by mouth 2 (two) times daily.    . carisoprodol (SOMA) 350 MG tablet Take 350 mg by mouth at bedtime.     . cholecalciferol (VITAMIN D) 1000 units tablet Take 2,000 Units by mouth 2 (two) times daily.    . DULoxetine (CYMBALTA) 60 MG capsule Take 60 mg by mouth at bedtime.     Marland Kitchen estazolam (PROSOM) 2 MG tablet Take 0.5-1 mg by mouth at bedtime.     . gabapentin (NEURONTIN) 600 MG tablet Take 600 mg by mouth 3 (three) times daily.     Marland Kitchen HYDROcodone-acetaminophen (NORCO) 10-325 MG per tablet Take 1 tablet by mouth every 6 (six) hours as needed for moderate pain.     . Levomefolate Glucosamine (METHYLFOLATE PO) Take 1 tablet by mouth every evening.    . Methylcobalamin 5000 MCG TBDP Take 1 tablet by mouth daily with lunch.     . morphine (MS CONTIN) 30 MG 12 hr tablet Take 30 mg by mouth 3 (three) times daily.    Marland Kitchen morphine (MS CONTIN) 60 MG 12 hr tablet Take 60 mg by mouth at bedtime.    . progesterone (PROMETRIUM) 200 MG capsule Take 200 mg by mouth daily  with lunch.    . pyridOXINE (VITAMIN B-6) 100 MG tablet Take 100 mg by mouth daily.      No facility-administered medications prior to visit.     ROS Review of Systems  Constitutional: Positive for fatigue. Negative for appetite change, diaphoresis and unexpected weight change.  HENT: Negative.   Eyes: Negative for visual disturbance.  Respiratory: Negative for cough, choking, chest tightness, shortness of breath and stridor.   Cardiovascular: Negative.  Negative for chest pain, palpitations and leg swelling.  Gastrointestinal: Negative.  Negative for abdominal pain, constipation, diarrhea, nausea and vomiting.  Endocrine: Negative.   Genitourinary: Negative.  Negative for difficulty urinating.  Musculoskeletal: Positive for arthralgias, back pain and myalgias. Negative for neck pain.  Skin: Negative.  Negative for color change.  Allergic/Immunologic: Negative.   Neurological: Negative.  Negative for dizziness, weakness, numbness and headaches.  Hematological: Negative.  Negative for adenopathy. Does not bruise/bleed easily.  Psychiatric/Behavioral: Positive for decreased concentration, dysphoric mood and sleep disturbance. Negative for agitation, behavioral problems, confusion, hallucinations, self-injury and suicidal ideas. The patient is not nervous/anxious and is not hyperactive.     Objective:  BP 124/88 (BP Location: Left Arm, Patient Position: Sitting, Cuff Size: Normal)   Pulse 89   Temp 98.5 F (36.9 C) (Oral)  Resp 16   Ht 5' 4.75" (1.645 m)   Wt 156 lb (70.8 kg)   SpO2 98%   BMI 26.16 kg/m   BP Readings from Last 3 Encounters:  02/17/16 124/88  02/10/16 128/78  02/10/15 122/84    Wt Readings from Last 3 Encounters:  02/17/16 156 lb (70.8 kg)  02/10/16 158 lb 12.8 oz (72 kg)  02/10/15 148 lb 12.8 oz (67.5 kg)    Physical Exam  Constitutional: She is oriented to person, place, and time. She appears well-developed and well-nourished.  Non-toxic appearance.  She does not have a sickly appearance. She does not appear ill. No distress.  She is very well kempt and well-groomed  HENT:  Mouth/Throat: Oropharynx is clear and moist. No oropharyngeal exudate.  Eyes: Conjunctivae are normal. Right eye exhibits no discharge. Left eye exhibits no discharge. No scleral icterus.  Neck: Normal range of motion. Neck supple. No JVD present. No tracheal deviation present. No thyromegaly present.  Cardiovascular: Normal rate, regular rhythm, normal heart sounds and intact distal pulses.  Exam reveals no gallop and no friction rub.   No murmur heard. Pulmonary/Chest: Effort normal and breath sounds normal. No stridor. No respiratory distress. She has no wheezes. She has no rales. She exhibits no tenderness.  Abdominal: Soft. Bowel sounds are normal. She exhibits no distension and no mass. There is no tenderness. There is no rebound and no guarding.  Musculoskeletal: Normal range of motion. She exhibits no edema or tenderness.  Lymphadenopathy:    She has no cervical adenopathy.  Neurological: She is oriented to person, place, and time.  Skin: Skin is warm and dry. No rash noted. She is not diaphoretic. No erythema. No pallor.  Psychiatric: She has a normal mood and affect. Her behavior is normal. Judgment and thought content normal. Her mood appears not anxious. Her affect is not angry, not blunt and not inappropriate. Her speech is not rapid and/or pressured, not delayed and not tangential. She is not aggressive, not slowed and not withdrawn. Thought content is not paranoid and not delusional. Cognition and memory are impaired. She does not exhibit a depressed mood. She expresses no homicidal and no suicidal ideation. She expresses no suicidal plans and no homicidal plans. She exhibits abnormal recent memory and abnormal remote memory. She is attentive.  Vitals reviewed.   Lab Results  Component Value Date   WBC 6.0 09/10/2009   HGB 14.2 09/10/2009   HCT 41.6  09/10/2009   PLT 189 09/10/2009    No results found.  Assessment & Plan:   Ruthmae was seen today for fibromyalgia.  Diagnoses and all orders for this visit:  Fibromyalgia- she will ask her doctor in [place] to transition her to someone here who will maintain her current regimen of medication since a appear to be working. She is not sure she wants to see a rheumatologist for pain management. She will let me know of her decision.  Vitamin D deficiency- she is taking a supplement, I have requested recent lab work including her vitamin D level   I am having Ms. Henault maintain her carisoprodol, estazolam, gabapentin, pyridOXINE, HYDROcodone-acetaminophen, DULoxetine, Methylcobalamin, Armodafinil, cholecalciferol, Levomefolate Glucosamine (METHYLFOLATE PO), morphine, morphine, Calcium Carbonate-Vitamin D, and progesterone.  No orders of the defined types were placed in this encounter.    Follow-up: Return in about 2 months (around 04/18/2016).  Scarlette Calico, MD

## 2016-02-17 NOTE — Patient Instructions (Signed)
Myofascial Pain Syndrome and Fibromyalgia  Myofascial pain syndrome and fibromyalgia are both pain disorders. This pain may be felt mainly in your muscles.   · Myofascial pain syndrome:    Always has trigger points or tender points in the muscle that will cause pain when pressed. The pain may come and go.    Usually affects your neck, upper back, and shoulder areas. The pain often radiates into your arms and hands.  · Fibromyalgia:    Has muscle pains and tenderness that come and go.    Is often associated with fatigue and sleep disturbances.    Has trigger points.    Tends to be long-lasting (chronic), but is not life-threatening.  Fibromyalgia and myofascial pain are not the same. However, they often occur together. If you have both conditions, each can make the other worse. Both are common and can cause enough pain and fatigue to make day-to-day activities difficult.   CAUSES   The exact causes of fibromyalgia and myofascial pain are not known. People with certain gene types may be more likely to develop fibromyalgia. Some factors can be triggers for both conditions, such as:   · Spine disorders.  · Arthritis.  · Severe injury (trauma) and other physical stressors.  · Being under a lot of stress.  · A medical illness.  SIGNS AND SYMPTOMS   Fibromyalgia  The main symptom of fibromyalgia is widespread pain and tenderness in your muscles. This can vary over time. Pain is sometimes described as stabbing, shooting, or burning. You may have tingling or numbness, too. You may also have sleep problems and fatigue. You may wake up feeling tired and groggy (fibro fog). Other symptoms may include:   · Bowel and bladder problems.  · Headaches.  · Visual problems.  · Problems with odors and noises.  · Depression or mood changes.  · Painful menstrual periods (dysmenorrhea).  · Dry skin or eyes.  Myofascial pain syndrome  Symptoms of myofascial pain syndrome include:   · Tight, ropy bands of muscle.    · Uncomfortable  sensations in muscular areas, such as:    Aching.    Cramping.    Burning.    Numbness.    Tingling.      Muscle weakness.  · Trouble moving certain muscles freely (range of motion).  DIAGNOSIS   There are no specific tests to diagnose fibromyalgia or myofascial pain syndrome. Both can be hard to diagnose because their symptoms are common in many other conditions. Your health care provider may suspect one or both of these conditions based on your symptoms and medical history. Your health care provider will also do a physical exam.   The key to diagnosing fibromyalgia is having pain, fatigue, and other symptoms for more than three months that cannot be explained by another condition.   The key to diagnosing myofascial pain syndrome is finding trigger points in muscles that are tender and cause pain elsewhere in your body (referred pain).  TREATMENT   Treating fibromyalgia and myofascial pain often requires a team of health care providers. This usually starts with your primary provider and a physical therapist. You may also find it helpful to work with alternative health care providers, such as massage therapists or acupuncturists.  Treatment for fibromyalgia may include medicines. This may include nonsteroidal anti-inflammatory drugs (NSAIDs), along with other medicines.   Treatment for myofascial pain may also include:  · NSAIDs.  · Cooling and stretching of muscles.  · Trigger point injections.  ·   Sound wave (ultrasound) treatments to stimulate muscles.  HOME CARE INSTRUCTIONS   · Take medicines only as directed by your health care provider.  · Exercise as directed by your health care provider or physical therapist.  · Try to avoid stressful situations.  · Practice relaxation techniques to control your stress. You may want to try:    Biofeedback.    Visual imagery.    Hypnosis.    Muscle relaxation.    Yoga.    Meditation.  · Talk to your health care provider about alternative treatments, such as acupuncture or  massage treatment.  · Maintain a healthy lifestyle. This includes eating a healthy diet and getting enough sleep.  · Consider joining a support group.  · Do not do activities that stress or strain your muscles. That includes repetitive motions and heavy lifting.  SEEK MEDICAL CARE IF:   · You have new symptoms.  · Your symptoms get worse.  · You have side effects from your medicines.  · You have trouble sleeping.  · Your condition is causing depression or anxiety.  FOR MORE INFORMATION   · National Fibromyalgia Association: http://www.fmaware.orgwww.fmaware.org  · Arthritis Foundation: http://www.arthritis.orgwww.arthritis.org  · American Chronic Pain Association: http://www.theacpa.org/condition/myofascial-painwww.theacpa.org/condition/myofascial-pain     This information is not intended to replace advice given to you by your health care provider. Make sure you discuss any questions you have with your health care provider.     Document Released: 04/05/2005 Document Revised: 04/26/2014 Document Reviewed: 01/09/2014  Elsevier Interactive Patient Education ©2016 [employer]

## 2016-02-21 DIAGNOSIS — E559 Vitamin D deficiency, unspecified: Secondary | ICD-10-CM | POA: Insufficient documentation

## 2016-03-18 ENCOUNTER — Ambulatory Visit: Payer: BLUE CROSS/BLUE SHIELD | Admitting: Nurse Practitioner

## 2016-04-08 ENCOUNTER — Encounter: Payer: Self-pay | Admitting: Internal Medicine

## 2016-04-08 ENCOUNTER — Other Ambulatory Visit (INDEPENDENT_AMBULATORY_CARE_PROVIDER_SITE_OTHER): Payer: BLUE CROSS/BLUE SHIELD

## 2016-04-08 ENCOUNTER — Ambulatory Visit (INDEPENDENT_AMBULATORY_CARE_PROVIDER_SITE_OTHER): Payer: BLUE CROSS/BLUE SHIELD | Admitting: Internal Medicine

## 2016-04-08 VITALS — BP 128/78 | HR 104 | Temp 98.0°F | Resp 16 | Ht 64.75 in | Wt 159.0 lb

## 2016-04-08 DIAGNOSIS — Z0000 Encounter for general adult medical examination without abnormal findings: Secondary | ICD-10-CM | POA: Diagnosis not present

## 2016-04-08 LAB — CBC WITH DIFFERENTIAL/PLATELET
BASOS ABS: 0 10*3/uL (ref 0.0–0.1)
Basophils Relative: 0.3 % (ref 0.0–3.0)
Eosinophils Absolute: 0.1 10*3/uL (ref 0.0–0.7)
Eosinophils Relative: 1.9 % (ref 0.0–5.0)
HCT: 39.4 % (ref 36.0–46.0)
Hemoglobin: 13.5 g/dL (ref 12.0–15.0)
LYMPHS ABS: 2.4 10*3/uL (ref 0.7–4.0)
Lymphocytes Relative: 35 % (ref 12.0–46.0)
MCHC: 34.3 g/dL (ref 30.0–36.0)
MCV: 88.9 fl (ref 78.0–100.0)
MONO ABS: 0.7 10*3/uL (ref 0.1–1.0)
Monocytes Relative: 10 % (ref 3.0–12.0)
NEUTROS PCT: 52.8 % (ref 43.0–77.0)
Neutro Abs: 3.6 10*3/uL (ref 1.4–7.7)
Platelets: 217 10*3/uL (ref 150.0–400.0)
RBC: 4.44 Mil/uL (ref 3.87–5.11)
RDW: 12.4 % (ref 11.5–15.5)
WBC: 6.8 10*3/uL (ref 4.0–10.5)

## 2016-04-08 LAB — COMPREHENSIVE METABOLIC PANEL
ALK PHOS: 45 U/L (ref 39–117)
ALT: 17 U/L (ref 0–35)
AST: 19 U/L (ref 0–37)
Albumin: 4.2 g/dL (ref 3.5–5.2)
BILIRUBIN TOTAL: 0.6 mg/dL (ref 0.2–1.2)
BUN: 11 mg/dL (ref 6–23)
CO2: 33 mEq/L — ABNORMAL HIGH (ref 19–32)
Calcium: 9.7 mg/dL (ref 8.4–10.5)
Chloride: 102 mEq/L (ref 96–112)
Creatinine, Ser: 0.69 mg/dL (ref 0.40–1.20)
GFR: 99.49 mL/min (ref >60.00)
GLUCOSE: 90 mg/dL (ref 70–99)
Potassium: 4.5 mEq/L (ref 3.5–5.1)
SODIUM: 139 meq/L (ref 135–145)
TOTAL PROTEIN: 7 g/dL (ref 6.0–8.3)

## 2016-04-08 LAB — TSH: TSH: 1.32 u[IU]/mL (ref 0.35–4.50)

## 2016-04-08 LAB — LIPID PANEL
Cholesterol: 150 mg/dL (ref 0–200)
HDL: 58.3 mg/dL (ref >39.00)
LDL Cholesterol: 80 mg/dL (ref 0–99)
NONHDL: 92.18
Total CHOL/HDL Ratio: 3
Triglycerides: 62 mg/dL (ref 0.0–149.0)
VLDL: 12.4 mg/dL (ref 0.0–40.0)

## 2016-04-08 NOTE — Progress Notes (Signed)
Subjective:  Patient ID: Regina Long, female    DOB: 1975-05-25  Age: 40 y.o. MRN: [id number]  CC: Annual Exam   HPI DHRUTI LASKER presents for a CPX.  She complains of ongoing trouble with memory loss and forgetfulness and diffuse widespread pain and fatigue but offers no new or different complaints today.  Outpatient Medications Prior to Visit  Medication Sig Dispense Refill  . Armodafinil (NUVIGIL) 150 MG tablet Take 150 mg by mouth daily.     . Calcium Carbonate-Vitamin D (CALCIUM 600+D) 600-400 MG-UNIT tablet Take 1 tablet by mouth 2 (two) times daily.    . carisoprodol (SOMA) 350 MG tablet Take 350 mg by mouth at bedtime.     . cholecalciferol (VITAMIN D) 1000 units tablet Take 2,000 Units by mouth 2 (two) times daily.    . DULoxetine (CYMBALTA) 60 MG capsule Take 60 mg by mouth at bedtime.     Marland Kitchen estazolam (PROSOM) 2 MG tablet Take 0.5-1 mg by mouth at bedtime.     . gabapentin (NEURONTIN) 600 MG tablet Take 600 mg by mouth 3 (three) times daily.     Marland Kitchen HYDROcodone-acetaminophen (NORCO) 10-325 MG per tablet Take 1 tablet by mouth every 6 (six) hours as needed for moderate pain.     . Levomefolate Glucosamine (METHYLFOLATE PO) Take 1 tablet by mouth every evening.    . Methylcobalamin 5000 MCG TBDP Take 1 tablet by mouth daily with lunch.     . morphine (MS CONTIN) 30 MG 12 hr tablet Take 30 mg by mouth 3 (three) times daily.    Marland Kitchen morphine (MS CONTIN) 60 MG 12 hr tablet Take 60 mg by mouth at bedtime.    . progesterone (PROMETRIUM) 200 MG capsule Take 200 mg by mouth daily with lunch.    . pyridOXINE (VITAMIN B-6) 100 MG tablet Take 100 mg by mouth daily.      No facility-administered medications prior to visit.     ROS Review of Systems  Constitutional: Positive for fatigue. Negative for activity change, appetite change, diaphoresis and unexpected weight change.  HENT: Negative.  Negative for trouble swallowing.   Eyes: Negative for visual disturbance.  Respiratory:  Negative for cough, chest tightness, shortness of breath and wheezing.   Cardiovascular: Negative for chest pain, palpitations and leg swelling.  Gastrointestinal: Negative for abdominal pain, constipation, diarrhea and nausea.  Endocrine: Negative.  Negative for cold intolerance and heat intolerance.  Genitourinary: Negative.  Negative for difficulty urinating.  Musculoskeletal: Positive for arthralgias, back pain and myalgias.  Skin: Negative.   Neurological: Negative.   Hematological: Negative.  Negative for adenopathy. Does not bruise/bleed easily.  Psychiatric/Behavioral: Positive for confusion and decreased concentration. Negative for dysphoric mood, sleep disturbance and suicidal ideas. The patient is not nervous/anxious.     Objective:  BP 128/78 (BP Location: Left Arm, Patient Position: Sitting, Cuff Size: Normal)   Pulse (!) 104   Temp 98 F (36.7 C) (Oral)   Resp 16   Ht 5' 4.75" (1.645 m)   Wt 159 lb (72.1 kg)   SpO2 98%   BMI 26.66 kg/m   BP Readings from Last 3 Encounters:  04/08/16 128/78  02/17/16 124/88  02/10/16 128/78    Wt Readings from Last 3 Encounters:  04/08/16 159 lb (72.1 kg)  02/17/16 156 lb (70.8 kg)  02/10/16 158 lb 12.8 oz (72 kg)    Physical Exam  Constitutional: She is oriented to person, place, and time. No distress.  HENT:  Mouth/Throat: Oropharynx is clear and moist. No oropharyngeal exudate.  Eyes: Conjunctivae are normal. Right eye exhibits no discharge. Left eye exhibits no discharge. No scleral icterus.  Neck: Normal range of motion. Neck supple. No JVD present. No tracheal deviation present. No thyromegaly present.  Cardiovascular: Normal rate, regular rhythm, normal heart sounds and intact distal pulses.  Exam reveals no gallop and no friction rub.   No murmur heard. Pulmonary/Chest: Effort normal and breath sounds normal. No stridor. No respiratory distress. She has no wheezes. She has no rales. She exhibits no tenderness.    Abdominal: Soft. Bowel sounds are normal. She exhibits no distension and no mass. There is no tenderness. There is no rebound and no guarding.  Musculoskeletal: Normal range of motion. She exhibits no edema, tenderness or deformity.  Lymphadenopathy:    She has no cervical adenopathy.  Neurological: She is oriented to person, place, and time.  Skin: Skin is warm and dry. No rash noted. She is not diaphoretic. No erythema. No pallor.  Psychiatric: She has a normal mood and affect. Her behavior is normal. Judgment and thought content normal.  Vitals reviewed.   Lab Results  Component Value Date   WBC 6.8 04/08/2016   HGB 13.5 04/08/2016   HCT 39.4 04/08/2016   PLT 217.0 04/08/2016   GLUCOSE 90 04/08/2016   CHOL 150 04/08/2016   TRIG 62.0 04/08/2016   HDL 58.30 04/08/2016   LDLCALC 80 04/08/2016   ALT 17 04/08/2016   AST 19 04/08/2016   NA 139 04/08/2016   K 4.5 04/08/2016   CL 102 04/08/2016   CREATININE 0.69 04/08/2016   BUN 11 04/08/2016   CO2 33 (H) 04/08/2016   TSH 1.32 04/08/2016    No results found.  Assessment & Plan:   Zanie was seen today for annual exam.  Diagnoses and all orders for this visit:  Routine general medical examination at a health care facility- she refused a flu vaccine, exam completed, labs ordered and reviewed, or Pap smear and mammogram are up-to-date, patient education material was given. -     Lipid panel; Future -     Comprehensive metabolic panel; Future -     CBC with Differential/Platelet; Future -     TSH; Future   I am having Ms. Engelken maintain her carisoprodol, estazolam, gabapentin, pyridOXINE, HYDROcodone-acetaminophen, DULoxetine, Methylcobalamin, Armodafinil, cholecalciferol, Levomefolate Glucosamine (METHYLFOLATE PO), morphine, morphine, Calcium Carbonate-Vitamin D, progesterone, and phentermine.  Meds ordered this encounter  Medications  . phentermine (ADIPEX-P) 37.5 MG tablet    Sig: Take 37.5 mg by mouth daily.     Refill:  2     Follow-up: Return if symptoms worsen or fail to improve.  Scarlette Calico, MD

## 2016-04-08 NOTE — Patient Instructions (Signed)

## 2016-04-08 NOTE — Progress Notes (Signed)
Pre visit review using our clinic review tool, if applicable. No additional management support is needed unless otherwise documented below in the visit note. 

## 2016-07-27 ENCOUNTER — Encounter: Payer: Self-pay | Admitting: Pulmonary Disease

## 2016-08-02 ENCOUNTER — Telehealth: Payer: Self-pay | Admitting: Pulmonary Disease

## 2016-08-02 DIAGNOSIS — G4731 Primary central sleep apnea: Secondary | ICD-10-CM

## 2016-08-02 DIAGNOSIS — G4719 Other hypersomnia: Secondary | ICD-10-CM

## 2016-08-02 NOTE — Telephone Encounter (Signed)
lmtcb X1 for pt  

## 2016-08-04 NOTE — Telephone Encounter (Signed)
lmtcb x2 for pt. 

## 2016-08-05 NOTE — Telephone Encounter (Signed)
lmtcb X3 for pt. 

## 2016-08-06 NOTE — Telephone Encounter (Signed)
Order placed. Patient aware. Nothing further needed.

## 2016-08-06 NOTE — Telephone Encounter (Signed)
Spoke with pt, who states she has been introduced to essential oils. These oils have helped manage her pain, pt is now taking half of her originally dose of pain meds. Pt is still taken prescribed dose of Prosom 2mg  and Armodafinil 150mg .  Pt states if she sits down throughout the day she will fall asleep, however is still having trouble sleeping at night with her mask. Pt is wanted to know VS will place order to DME for attachment that can be placed on her mask, that will dispense essential oils while sleeping.  VS please advise. Thanks.

## 2016-08-06 NOTE — Telephone Encounter (Signed)
Please place order for CPAP essential oil infusion adapter with her DME.

## 2016-08-10 ENCOUNTER — Ambulatory Visit: Payer: BLUE CROSS/BLUE SHIELD | Admitting: Pulmonary Disease

## 2016-08-13 ENCOUNTER — Telehealth: Payer: Self-pay | Admitting: Pulmonary Disease

## 2016-08-13 NOTE — Telephone Encounter (Signed)
lmomtcb x1 

## 2016-08-13 NOTE — Telephone Encounter (Signed)
Patient states she has spoken with [employer] directly and they do not give approval to use essential oils in her machine.  She states she is back to "square one".  See previous message.

## 2016-08-16 NOTE — Telephone Encounter (Signed)
lmtcb X2 for pt.  

## 2016-08-17 ENCOUNTER — Ambulatory Visit: Payer: BLUE CROSS/BLUE SHIELD | Admitting: Pulmonary Disease

## 2016-08-17 NOTE — Telephone Encounter (Signed)
lmomtcb x 3  

## 2016-08-18 NOTE — Telephone Encounter (Signed)
Attempted to contact the pt x 4.  Will sign off of message per protocol.

## 2016-08-19 ENCOUNTER — Telehealth: Payer: Self-pay | Admitting: Internal Medicine

## 2016-08-19 ENCOUNTER — Encounter: Payer: Self-pay | Admitting: Pulmonary Disease

## 2016-08-19 ENCOUNTER — Ambulatory Visit (INDEPENDENT_AMBULATORY_CARE_PROVIDER_SITE_OTHER): Payer: BLUE CROSS/BLUE SHIELD | Admitting: Pulmonary Disease

## 2016-08-19 VITALS — BP 120/72 | HR 77 | Ht 65.0 in | Wt 138.8 lb

## 2016-08-19 DIAGNOSIS — G4719 Other hypersomnia: Secondary | ICD-10-CM | POA: Diagnosis not present

## 2016-08-19 DIAGNOSIS — G4731 Primary central sleep apnea: Secondary | ICD-10-CM

## 2016-08-19 NOTE — Telephone Encounter (Signed)
Routed to dr Ronnald Ramp, Jul[facility]ier

## 2016-08-19 NOTE — Progress Notes (Signed)
Current Outpatient Prescriptions on File Prior to Visit  Medication Sig  . Armodafinil (NUVIGIL) 150 MG tablet Take 150 mg by mouth daily.   . carisoprodol (SOMA) 350 MG tablet Take 175 mg by mouth at bedtime.   . DULoxetine (CYMBALTA) 60 MG capsule Take 30 mg by mouth at bedtime.   Marland Kitchen estazolam (PROSOM) 2 MG tablet Take 0.5-1 mg by mouth at bedtime.   . gabapentin (NEURONTIN) 600 MG tablet Take 300 mg by mouth 3 (three) times daily.   . Levomefolate Glucosamine (METHYLFOLATE PO) Take 1 tablet by mouth every evening.  . Methylcobalamin 5000 MCG TBDP Take 1 tablet by mouth daily with lunch.   . morphine (MS CONTIN) 30 MG 12 hr tablet Take 15 mg by mouth 3 (three) times daily.   Marland Kitchen morphine (MS CONTIN) 60 MG 12 hr tablet Take 30 mg by mouth at bedtime.   . phentermine (ADIPEX-P) 37.5 MG tablet Take 18.75 mg by mouth daily.    No current facility-administered medications on file prior to visit.      Chief Complaint  Patient presents with  . Follow-up    Using CPAP nightly. Pt to bring SD card back to download. Pt states that the essential oils seem to be helping tramendously but she is still not able to get the attachment for her CPAP machine -- pt not sleeping well at night.      Sleep tests PSG 12/05/11 >> AHI 16.4, SpO2 low 86% >> Central apnea index 11.1, Obstructive apnea index 1.2, Mixed apnea index 0.2, Hypopnea index 3.9. This is consistent with central sleep apnea.  ONO with CPAP and RA 05/01/12 >> Test time 6 hrs 19 min. Mean SpO2 97%, low SpO2 79%. Spent 12 sec with SpO2 < 88%. Supplemental oxygen not indicated.  05/02/12 CPAP mask desensitization [name] 11/12/15 to 02/09/16 >> used on 82 of 90 nights with average 4 hrs 4 min.  Average AHI 1.7 with median pressure 10 and 95 th percentile pressure 14 cm H2O  Past medical history HA, Fibromyalgia, IBS, DJD, Memory deficit  Past surgical history, Family history, Social history, Allergies all reviewed.  Vital Signs BP 120/72 (BP  Location: Left Arm, Cuff Size: Normal)   Pulse 77   Ht 5\' 5"  (1.651 m)   Wt 138 lb 12.8 oz (63 kg)   SpO2 99%   BMI 23.10 kg/m   History of Present Illness Regina Long is a 41 y.o. 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  She has been working on coming down on her medications.  She had to slow down with process for cymbalta.    He still gets sleepy during the day, but then can have trouble staying asleep.  She was advised to try aroma therapy.  She would like to get adapter to use essential oils for her [name].  She is drinking 2 to 4 cups of coffee per day.  She uses nuvigil on Sunday and Wednesday when she goes to church.  She has decreased dose of phentermine in half.    Her pain symptoms are better.  She has lost about 20 lbs.   Physical Exam  General - pleasant Eyes - pupils reactive ENT - no sinus tenderness, no oral exudate, no LAN Cardiac - regular, no murmur Chest - no wheeze, rales Abd - soft, non tender Ext - no edema Skin - no rashes Neuro - normal strength Psych - anxious   Assessment/Plan  Complex sleep apnea. - she reports compliance with therapy  and benefit - she will bring in card for VPAP - will call her once I review options for adapter to use essential oils   Patient Instructions  Follow up in 1 year   Chesley Mires, MD [place] Pager:  [phone] 08/19/2016, 12:22 PM

## 2016-08-19 NOTE — Telephone Encounter (Signed)
Regina Long came in and wanted to let Dr Ronnald Ramp know that since he has seen her last she has lost 21 pounds and has decreased her medications tremendously by using essential oils. She said that she has lowered her Soma, Gabapentin, Morphine and Cymbalta and has not used her Hydrocodone, Lidocaine Patches or Lidocaine Cream since before Christmas!

## 2016-08-19 NOTE — Patient Instructions (Signed)
Follow up in 1 year.

## 2016-08-20 NOTE — Telephone Encounter (Signed)
This is all great news!

## 2016-09-01 ENCOUNTER — Ambulatory Visit: Payer: BLUE CROSS/BLUE SHIELD | Admitting: Pulmonary Disease

## 2016-09-07 ENCOUNTER — Telehealth: Payer: Self-pay | Admitting: Pulmonary Disease

## 2016-09-07 NOTE — Telephone Encounter (Signed)
Called and spoke with pt and she is aware of VS recs.  She stated that she does not have trouble finding the part, but she wanted to see if VS could give her an item number from [place] [facility].  VS please advise. Thanks

## 2016-09-07 NOTE — Telephone Encounter (Signed)
Pt states that it is not looking like the aromatherapy adaptor on [email or web address] will fit on her machine.  Pt found another adaptor online on another website and would like Dr Halford Chessman to look at it and see if he can help find the correct adaptor for her machine. Pt uses [facility] VPAP Adapt S9  [email or web address].aromatools.com/catalogsearch/result/?[email or web address]&q=[email or web address]  Please advise Dr Halford Chessman. Thanks.

## 2016-09-07 NOTE — Telephone Encounter (Signed)
She can try [employer] or something similar.

## 2016-09-07 NOTE — Telephone Encounter (Signed)
LM x 1 

## 2016-09-07 NOTE — Telephone Encounter (Signed)
I am not aware of an item number for this.  My understanding is that this fits all types of PAP devices.

## 2016-09-07 NOTE — Telephone Encounter (Signed)
VS pt is calling and stating that you had advised her that you would be able to locate her an adapter for her cpap.  She stated that she did get the last message about ordering this online, but she stated that VS told her that he could locate one for her.  VS please advise. Thanks  Allergies  Allergen Reactions  . Influenza Vaccines Other (See Comments)    Flare up of fibromyalgia  . Ritalin [Methylphenidate Hcl] Other (See Comments)    Pt states that this medication "knocked her out".

## 2016-09-07 NOTE — Telephone Encounter (Signed)
Spoke with patient, aware of information per Dr Halford Chessman. Pt is going to research on [employer] and will let us know if she needs anything else from our office. Nothing further needed.

## 2016-09-07 NOTE — Telephone Encounter (Signed)
Pt returning call again.Regina Long ° °

## 2016-09-08 NOTE — Telephone Encounter (Signed)
Spoke with the pt and notified of recs per VS Nothing further needed

## 2016-09-08 NOTE — Telephone Encounter (Signed)
This appears to have a universal adaptor for mask and tubing, and should work with her current set up.  The type of machine she uses is not the determining factor for fit; it is the type of tubing and mask she uses since the adaptor interfaces between the tubing and the mask.

## 2016-10-14 ENCOUNTER — Telehealth: Payer: Self-pay | Admitting: Pulmonary Disease

## 2016-10-14 NOTE — Telephone Encounter (Signed)
SD card received from patient and download printed - placed in VS' lookat. Patient would like VS to know  her weight is now down to 132.04 from 138 at her last ov  Sometimes she still sits in bed with the mask on and has trouble falling sleep BUT once she does fall asleep she is having a "deeper sleep"  Pt is using essential oils with her machine (she was able to find an adapter) and feels like this is helping a great deal   Routing to VS to advise on the DL results

## 2016-10-19 ENCOUNTER — Encounter: Payer: Self-pay | Admitting: *Deleted

## 2016-10-19 NOTE — Telephone Encounter (Signed)
LMOM TCB x1 for pt to inform her that VS is not in the office/available until this Friday 7.6.18 to view her DL results Pt is active in [place], so e-mail was sent to her with that information  Routing back to VS to advise when he is back in the office

## 2016-10-22 NOTE — Telephone Encounter (Signed)
[name] 07/17/16 to 10/14/16 >> used on 82 of 90 nights with average 5 hrs 9 min.  Average AHI 1.4 with median IPAP 10 and 95 th percentile IPAP 14 cm H2O.  Max PS 15, Min PS 3, EPAP 6 cm H2O.   Please let pt know [name] report shows good control of sleep apnea with current settings.

## 2016-10-22 NOTE — Telephone Encounter (Signed)
lmtcb x1 for pt. 

## 2016-10-25 NOTE — Telephone Encounter (Signed)
lmomtcb x 2  

## 2016-10-27 NOTE — Telephone Encounter (Signed)
Results have been explained to patient, pt expressed understanding. Nothing further needed.  

## 2017-01-03 ENCOUNTER — Other Ambulatory Visit: Payer: Self-pay | Admitting: Obstetrics and Gynecology

## 2017-01-03 DIAGNOSIS — R928 Other abnormal and inconclusive findings on diagnostic imaging of breast: Secondary | ICD-10-CM

## 2017-01-07 ENCOUNTER — Ambulatory Visit
Admission: RE | Admit: 2017-01-07 | Discharge: 2017-01-07 | Disposition: A | Discharge status: Home / Self Care | Payer: BLUE CROSS/BLUE SHIELD | Source: Ambulatory Visit | Attending: Obstetrics and Gynecology | Admitting: Obstetrics and Gynecology

## 2017-01-07 DIAGNOSIS — R928 Other abnormal and inconclusive findings on diagnostic imaging of breast: Secondary | ICD-10-CM

## 2017-01-10 ENCOUNTER — Other Ambulatory Visit: Payer: BLUE CROSS/BLUE SHIELD

## 2017-04-21 ENCOUNTER — Ambulatory Visit (INDEPENDENT_AMBULATORY_CARE_PROVIDER_SITE_OTHER): Payer: BLUE CROSS/BLUE SHIELD | Admitting: Internal Medicine

## 2017-04-21 ENCOUNTER — Encounter: Payer: Self-pay | Admitting: Internal Medicine

## 2017-04-21 VITALS — BP 112/74 | HR 61 | Temp 98.8°F | Resp 16 | Ht 65.0 in | Wt 129.0 lb

## 2017-04-21 DIAGNOSIS — Z0000 Encounter for general adult medical examination without abnormal findings: Secondary | ICD-10-CM | POA: Diagnosis not present

## 2017-04-21 NOTE — Progress Notes (Signed)
Subjective:  Patient ID: Regina Long, female    DOB: May 12, 1975  Age: 42 y.o. MRN: [id number]  CC: Annual Exam   HPI Regina Long presents for a CPX.    She tells been that she has felt better recently that she has in a long time.  She has less pain and less fatigue.  She has been able to discontinue 1 of her morphine doses.  She has felt so well that she has decided to stop taking gabapentin.  Outpatient Medications Prior to Visit  Medication Sig Dispense Refill  . Armodafinil (NUVIGIL) 150 MG tablet Take 75 mg by mouth once a week. On Sunday    . carisoprodol (SOMA) 350 MG tablet Take 350 mg by mouth at bedtime.     . DULoxetine (CYMBALTA) 60 MG capsule Take 30 mg by mouth at bedtime.     Marland Kitchen estazolam (PROSOM) 2 MG tablet Take 0.5-1 mg by mouth at bedtime.     . Levomefolate Glucosamine (METHYLFOLATE PO) Take 1 tablet by mouth every evening.    . Methylcobalamin 5000 MCG TBDP Take 1 tablet by mouth daily with lunch.     . morphine (MS CONTIN) 30 MG 12 hr tablet Take 15 mg by mouth 3 (three) times daily.     . phentermine (ADIPEX-P) 37.5 MG tablet Take 18.75 mg by mouth daily.   2  . UNABLE TO FIND Med Name: [facility] Life Long Vitality Pack (xEOmega, AlphaCRS, Micro Plex VMz)    . morphine (MS CONTIN) 60 MG 12 hr tablet Take 30 mg by mouth at bedtime.     . gabapentin (NEURONTIN) 600 MG tablet Take 300 mg by mouth 3 (three) times daily.      No facility-administered medications prior to visit.     ROS Review of Systems  Constitutional: Positive for fatigue. Negative for activity change and appetite change.  HENT: Negative.   Eyes: Negative for visual disturbance.  Respiratory: Negative for cough, chest tightness and shortness of breath.   Cardiovascular: Negative for chest pain and leg swelling.  Gastrointestinal: Negative for abdominal pain, constipation, diarrhea, nausea and vomiting.  Endocrine: Negative.   Genitourinary: Negative.   Musculoskeletal: Positive for  myalgias. Negative for arthralgias.  Skin: Negative.  Negative for color change and rash.  Allergic/Immunologic: Negative.   Neurological: Negative.  Negative for dizziness, weakness, light-headedness and headaches.  Hematological: Negative for adenopathy. Does not bruise/bleed easily.  Psychiatric/Behavioral: Negative.     Objective:  BP 112/74 (BP Location: Left Arm, Patient Position: Sitting, Cuff Size: Normal)   Pulse 61   Temp 98.8 F (37.1 C) (Oral)   Resp 16   Ht 5\' 5"  (1.651 m)   Wt 129 lb (58.5 kg)   SpO2 98%   BMI 21.47 kg/m   BP Readings from Last 3 Encounters:  04/21/17 112/74  08/19/16 120/72  04/08/16 128/78    Wt Readings from Last 3 Encounters:  04/21/17 129 lb (58.5 kg)  08/19/16 138 lb 12.8 oz (63 kg)  04/08/16 159 lb (72.1 kg)    Physical Exam  Constitutional: She is oriented to person, place, and time. No distress.  HENT:  Mouth/Throat: Oropharynx is clear and moist. No oropharyngeal exudate.  Eyes: Conjunctivae are normal. Left eye exhibits no discharge. No scleral icterus.  Neck: Normal range of motion. Neck supple. No JVD present. No thyromegaly present.  Cardiovascular: Normal rate, regular rhythm and normal heart sounds.  No murmur heard. Pulmonary/Chest: Effort normal and breath sounds normal.  No respiratory distress. She has no wheezes. She has no rales.  Abdominal: Soft. Bowel sounds are normal. She exhibits no distension and no mass. There is no tenderness. There is no guarding.  Musculoskeletal: Normal range of motion. She exhibits no edema or tenderness.  Lymphadenopathy:    She has no cervical adenopathy.  Neurological: She is alert and oriented to person, place, and time.  Skin: Skin is warm and dry. No rash noted. She is not diaphoretic. No erythema. No pallor.  Psychiatric: She has a normal mood and affect. Her behavior is normal. Judgment and thought content normal.  Vitals reviewed.   Lab Results  Component Value Date   WBC 6.8  04/08/2016   HGB 13.5 04/08/2016   HCT 39.4 04/08/2016   PLT 217.0 04/08/2016   GLUCOSE 90 04/08/2016   CHOL 150 04/08/2016   TRIG 62.0 04/08/2016   HDL 58.30 04/08/2016   LDLCALC 80 04/08/2016   ALT 17 04/08/2016   AST 19 04/08/2016   NA 139 04/08/2016   K 4.5 04/08/2016   CL 102 04/08/2016   CREATININE 0.69 04/08/2016   BUN 11 04/08/2016   CO2 33 (H) 04/08/2016   TSH 1.32 04/08/2016    US Breast Ltd Uni Left Inc Axilla  Result Date: 01/07/2017 CLINICAL DATA:  Screening recall for possible left breast masses. EXAM: 2D DIGITAL DIAGNOSTIC UNILATERAL LEFT MAMMOGRAM WITH CAD AND ADJUNCT TOMO LEFT BREAST ULTRASOUND COMPARISON:  Previous exam(s). ACR Breast Density Category d: The breast tissue is extremely dense, which lowers the sensitivity of mammography. FINDINGS: There are possible obscured masses identified on the tomosynthesis images in the superior and lower inner quadrants of the left breast. Mammographic images were processed with CAD. Ultrasound of the superior medial and inferior left breast demonstrate numerous anechoic circumscribed masses consistent with benign cysts. No suspicious masses or areas of shadowing are identified. IMPRESSION: Multiple benign cysts are identified in the superior medial and inferior left breast corresponding with the masses identified mammographically. No evidence of left breast malignancy. RECOMMENDATION: Screening mammogram in one year.(Code:SM-B-01Y) I have discussed the findings and recommendations with the patient. Results were also provided in writing at the conclusion of the visit. If applicable, a reminder letter will be sent to the patient regarding the next appointment. BI-RADS CATEGORY  2: Benign. Electronically Signed   By: Ammie Ferrier M.D.   On: 01/07/2017 15:20   Mm Diag Breast Tomo Uni Left  Result Date: 01/07/2017 CLINICAL DATA:  Screening recall for possible left breast masses. EXAM: 2D DIGITAL DIAGNOSTIC UNILATERAL LEFT MAMMOGRAM  WITH CAD AND ADJUNCT TOMO LEFT BREAST ULTRASOUND COMPARISON:  Previous exam(s). ACR Breast Density Category d: The breast tissue is extremely dense, which lowers the sensitivity of mammography. FINDINGS: There are possible obscured masses identified on the tomosynthesis images in the superior and lower inner quadrants of the left breast. Mammographic images were processed with CAD. Ultrasound of the superior medial and inferior left breast demonstrate numerous anechoic circumscribed masses consistent with benign cysts. No suspicious masses or areas of shadowing are identified. IMPRESSION: Multiple benign cysts are identified in the superior medial and inferior left breast corresponding with the masses identified mammographically. No evidence of left breast malignancy. RECOMMENDATION: Screening mammogram in one year.(Code:SM-B-01Y) I have discussed the findings and recommendations with the patient. Results were also provided in writing at the conclusion of the visit. If applicable, a reminder letter will be sent to the patient regarding the next appointment. BI-RADS CATEGORY  2: Benign. Electronically Signed  By: Ammie Ferrier M.D.   On: 01/07/2017 15:20    Assessment & Plan:   Fronia was seen today for annual exam.  Diagnoses and all orders for this visit:  Routine general medical examination at a health care facility- exam completed, no labs are indicated, she refused a flu vaccine, Pap and mammogram are up-to-date, patient education material was given.   I have discontinued Zenia Resides. Fortunato's gabapentin. I am also having her maintain her carisoprodol, estazolam, DULoxetine, Methylcobalamin, Armodafinil, Levomefolate Glucosamine (METHYLFOLATE PO), morphine, morphine, phentermine, and UNABLE TO FIND.  No orders of the defined types were placed in this encounter.    Follow-up: Return if symptoms worsen or fail to improve.  Scarlette Calico, MD

## 2017-04-21 NOTE — Patient Instructions (Signed)
Preventive Care 18-39 Years, Female Preventive care refers to lifestyle choices and visits with your health care provider that can promote health and wellness. What does preventive care include?  A yearly physical exam. This is also called an annual well check.  Dental exams once or twice a year.  Routine eye exams. Ask your health care provider how often you should have your eyes checked.  Personal lifestyle choices, including: ? Daily care of your teeth and gums. ? Regular physical activity. ? Eating a healthy diet. ? Avoiding tobacco and drug use. ? Limiting alcohol use. ? Practicing safe sex. ? Taking vitamin and mineral supplements as recommended by your health care provider. What happens during an annual well check? The services and screenings done by your health care provider during your annual well check will depend on your age, overall health, lifestyle risk factors, and family history of disease. Counseling Your health care provider may ask you questions about your:  Alcohol use.  Tobacco use.  Drug use.  Emotional well-being.  Home and relationship well-being.  Sexual activity.  Eating habits.  Work and work Statistician.  Method of birth control.  Menstrual cycle.  Pregnancy history.  Screening You may have the following tests or measurements:  Height, weight, and BMI.  Diabetes screening. This is done by checking your blood sugar (glucose) after you have not eaten for a while (fasting).  Blood pressure.  Lipid and cholesterol levels. These may be checked every 5 years starting at age 66.  Skin check.  Hepatitis C blood test.  Hepatitis B blood test.  Sexually transmitted disease (STD) testing.  BRCA-related cancer screening. This may be done if you have a family history of breast, ovarian, tubal, or peritoneal cancers.  Pelvic exam and Pap test. This may be done every 3 years starting at age 40. Starting at age 59, this may be done every 5  years if you have a Pap test in combination with an HPV test.  Discuss your test results, treatment options, and if necessary, the need for more tests with your health care provider. Vaccines Your health care provider may recommend certain vaccines, such as:  Influenza vaccine. This is recommended every year.  Tetanus, diphtheria, and acellular pertussis (Tdap, Td) vaccine. You may need a Td booster every 10 years.  Varicella vaccine. You may need this if you have not been vaccinated.  HPV vaccine. If you are 69 or younger, you may need three doses over 6 months.  Measles, mumps, and rubella (MMR) vaccine. You may need at least one dose of MMR. You may also need a second dose.  Pneumococcal 13-valent conjugate (PCV13) vaccine. You may need this if you have certain conditions and were not previously vaccinated.  Pneumococcal polysaccharide (PPSV23) vaccine. You may need one or two doses if you smoke cigarettes or if you have certain conditions.  Meningococcal vaccine. One dose is recommended if you are age 27-21 years and a first-year college student living in a residence hall, or if you have one of several medical conditions. You may also need additional booster doses.  Hepatitis A vaccine. You may need this if you have certain conditions or if you travel or work in places where you may be exposed to hepatitis A.  Hepatitis B vaccine. You may need this if you have certain conditions or if you travel or work in places where you may be exposed to hepatitis B.  Haemophilus influenzae type b (Hib) vaccine. You may need this if  you have certain risk factors.  Talk to your health care provider about which screenings and vaccines you need and how often you need them. This information is not intended to replace advice given to you by your health care provider. Make sure you discuss any questions you have with your health care provider. Document Released: 06/01/2001 Document Revised: 12/24/2015  Document Reviewed: 02/04/2015 Elsevier Interactive Patient Education  [employer].

## 2017-10-26 ENCOUNTER — Ambulatory Visit (INDEPENDENT_AMBULATORY_CARE_PROVIDER_SITE_OTHER): Payer: BLUE CROSS/BLUE SHIELD | Admitting: Pulmonary Disease

## 2017-10-26 ENCOUNTER — Encounter: Payer: Self-pay | Admitting: Pulmonary Disease

## 2017-10-26 VITALS — BP 124/74 | HR 75 | Ht 65.0 in | Wt 123.6 lb

## 2017-10-26 DIAGNOSIS — G4731 Primary central sleep apnea: Secondary | ICD-10-CM | POA: Diagnosis not present

## 2017-10-26 NOTE — Patient Instructions (Signed)
Will arrange for new [name] mask Will call with download from [name] machine  Follow up in 1 year

## 2017-10-26 NOTE — Progress Notes (Signed)
[facility]  Chief Complaint  Patient presents with  . Follow-up    Pt forgot SD card, will bring by next week. Pt is not sleeping a full night, but doing okay.    Constitutional: BP 124/74 (BP Location: Left Arm, Cuff Size: Normal)   Pulse 75   Ht 5\' 5"  (1.651 m)   Wt 123 lb 9.6 oz (56.1 kg)   SpO2 99%   BMI 20.57 kg/m   History of Present Illness: Regina Long is a 42 y.o. 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  Lost 15 lbs since last year.  Not needing as much medication.  Still feels she needs [name].  Sleep is worse w/o it.  Wants to try smaller mask.  Still using essential oils, and feels this has helped.   Comprehensive Respiratory Exam:  Appearance - well kempt, smells of essential oils  ENMT - nasal mucosa moist, turbinates clear, midline nasal septum, no dental lesions, no gingival bleeding, no oral exudates, no tonsillar hypertrophy, MP 4, enlarged tongue Neck - no masses, trachea midline, no thyromegaly, no elevation in JVP Respiratory - normal appearance of chest wall, normal respiratory effort w/o accessory muscle use, no dullness on percussion, no wheezing or rales CV - s1s2 regular rate and rhythm, no murmurs, no peripheral edema, no varicosities, radial pulses symmetric GI - soft, non tender Lymph - no adenopathy noted in neck and axillary areas MSK - normal muscle strength and tone, normal gait Ext - no cyanosis, clubbing, or joint inflammation noted Skin - no rashes, lesions, or ulcers Neuro - oriented to person, place, and time Psych - normal mood and affect  Assessment/Plan:  Complex sleep apnea. - she is compliant with therapy and reports benefit - will call her with results of her download - will have her refitted for nasal mask or nasal pillow mask   Patient Instructions  Will arrange for new [name] mask Will call with download from [name] machine  Follow up in 1 year    Chesley Mires, MD [place] 10/26/2017, 3:37 PM  Flow Sheet  Sleep tests: PSG 12/05/11 >>AHI 16.4, SpO2 low 86% >>Central apnea index 11.1, Obstructive apnea index 1.2, Mixed apnea index 0.2, Hypopnea index 3.9. This is consistent with central sleep apnea.  ONO with CPAP and RA 05/01/12 >>Test time 6 hrs 19 min. Mean SpO2 97%, low SpO2 79%. Spent 12 sec with SpO2 <88%. Supplemental oxygen not indicated.  05/02/12 CPAP mask desensitization [name] 07/17/16 to 10/14/16 >> used on 82 of 90 nights with average 5 hrs 9 min.  Average AHI 1.4 with median IPAP 10 and 95 th percentile IPAP 14 cm H2O.  Max PS 15, Min PS 3, EPAP 6 cm H2O.  Past Medical History: She  has a past medical history of Arthritis, Chronic headache, Complex sleep apnea syndrome, DJD (degenerative joint disease), Fibromyalgia, IBS (irritable bowel syndrome), and Short-term memory loss.  Past Surgical History: She  has a past surgical history that includes Tubal ligation and 3 laproscopies.  Family History: Her family history includes Cancer in her unknown relative; Heart attack in her maternal grandfather; Heart disease in her unknown relative; Prostate cancer in her father.  Social History: She  reports that she quit smoking about 13 years ago. She quit after 3.00 years of use. She has never used smokeless tobacco. She reports that she does not drink alcohol or use drugs.  Medications: Allergies as of 10/26/2017  Reactions   Influenza Vaccines Other (See Comments)   Flare up of fibromyalgia   Ritalin [methylphenidate Hcl] Other (See Comments)   Pt states that this medication "knocked her out".        Medication List        Accurate as of 10/26/17  3:37 PM. Always use your most recent med list.          carisoprodol 350 MG tablet Commonly known as:  SOMA Take 175 mg by mouth at bedtime.   DULoxetine 60 MG capsule Commonly known as:  CYMBALTA Take 60 mg by mouth at bedtime.   estazolam 2 MG tablet Commonly known  as:  PROSOM Take 0.5 mg by mouth at bedtime.   morphine 30 MG 12 hr tablet Commonly known as:  MS CONTIN Take 15 mg by mouth 3 (three) times daily.   UNABLE TO FIND Med Name: [facility] Life Long Vitality Pack (xEOmega, AlphaCRS, Micro Plex VMz)

## 2017-11-03 ENCOUNTER — Telehealth: Payer: Self-pay | Admitting: Pulmonary Disease

## 2017-11-03 DIAGNOSIS — G4733 Obstructive sleep apnea (adult) (pediatric): Secondary | ICD-10-CM

## 2017-11-03 NOTE — Telephone Encounter (Signed)
Download has been done. Reports has been placed in VS look at.  VS - please advise. Thanks.

## 2017-11-07 NOTE — Telephone Encounter (Signed)
[name] 10/04/17 to 11/02/17 >> used on 30 of 30 nights with average 7 hrs 42 min.  Average AHI 0.4 with median IPAP 9 and 95 th percentile IPAP 11 cmH2O.  EPAP 6, Min PS 3, Max PS 15.   Please let her know [name] settings look good with good control of sleep apnea.

## 2017-11-09 NOTE — Telephone Encounter (Signed)
Pt I aware of results of Download. Pt then asked about getting a different mask-states she told someone that she used [employer] but pt rec'd letter from [place] for her supplies-pt will need order for mask to [facility]. Order has been placed and nothing more needed at this time.

## 2018-04-07 ENCOUNTER — Other Ambulatory Visit: Payer: Self-pay | Admitting: Obstetrics and Gynecology

## 2018-04-07 DIAGNOSIS — R928 Other abnormal and inconclusive findings on diagnostic imaging of breast: Secondary | ICD-10-CM

## 2018-04-10 LAB — HM PAP SMEAR

## 2018-04-14 ENCOUNTER — Other Ambulatory Visit: Payer: BLUE CROSS/BLUE SHIELD

## 2018-04-24 ENCOUNTER — Encounter: Payer: BLUE CROSS/BLUE SHIELD | Admitting: Internal Medicine

## 2018-04-26 ENCOUNTER — Ambulatory Visit
Admission: RE | Admit: 2018-04-26 | Discharge: 2018-04-26 | Disposition: A | Discharge status: Home / Self Care | Payer: BLUE CROSS/BLUE SHIELD | Source: Ambulatory Visit | Attending: Obstetrics and Gynecology | Admitting: Obstetrics and Gynecology

## 2018-04-26 ENCOUNTER — Ambulatory Visit: Payer: BLUE CROSS/BLUE SHIELD

## 2018-04-26 DIAGNOSIS — R928 Other abnormal and inconclusive findings on diagnostic imaging of breast: Secondary | ICD-10-CM

## 2018-04-26 LAB — HM MAMMOGRAPHY

## 2018-05-11 ENCOUNTER — Ambulatory Visit (INDEPENDENT_AMBULATORY_CARE_PROVIDER_SITE_OTHER): Payer: BLUE CROSS/BLUE SHIELD | Admitting: Internal Medicine

## 2018-05-11 ENCOUNTER — Other Ambulatory Visit (INDEPENDENT_AMBULATORY_CARE_PROVIDER_SITE_OTHER): Payer: BLUE CROSS/BLUE SHIELD

## 2018-05-11 ENCOUNTER — Encounter: Payer: Self-pay | Admitting: Internal Medicine

## 2018-05-11 VITALS — BP 120/68 | HR 68 | Temp 97.9°F | Ht 65.0 in | Wt 126.0 lb

## 2018-05-11 DIAGNOSIS — Z0000 Encounter for general adult medical examination without abnormal findings: Secondary | ICD-10-CM

## 2018-05-11 DIAGNOSIS — M797 Fibromyalgia: Secondary | ICD-10-CM | POA: Diagnosis not present

## 2018-05-11 DIAGNOSIS — E559 Vitamin D deficiency, unspecified: Secondary | ICD-10-CM

## 2018-05-11 LAB — CBC WITH DIFFERENTIAL/PLATELET
BASOS ABS: 0 10*3/uL (ref 0.0–0.1)
Basophils Relative: 0.2 % (ref 0.0–3.0)
EOS ABS: 0.2 10*3/uL (ref 0.0–0.7)
EOS PCT: 4 % (ref 0.0–5.0)
HCT: 38.4 % (ref 36.0–46.0)
Hemoglobin: 12.9 g/dL (ref 12.0–15.0)
Lymphocytes Relative: 33.6 % (ref 12.0–46.0)
Lymphs Abs: 1.7 10*3/uL (ref 0.7–4.0)
MCHC: 33.4 g/dL (ref 30.0–36.0)
MCV: 94 fl (ref 78.0–100.0)
MONO ABS: 0.4 10*3/uL (ref 0.1–1.0)
Monocytes Relative: 8.5 % (ref 3.0–12.0)
NEUTROS PCT: 53.7 % (ref 43.0–77.0)
Neutro Abs: 2.7 10*3/uL (ref 1.4–7.7)
Platelets: 192 10*3/uL (ref 150.0–400.0)
RBC: 4.09 Mil/uL (ref 3.87–5.11)
RDW: 12.4 % (ref 11.5–15.5)
WBC: 5.1 10*3/uL (ref 4.0–10.5)

## 2018-05-11 LAB — COMPREHENSIVE METABOLIC PANEL
ALK PHOS: 36 U/L — AB (ref 39–117)
ALT: 11 U/L (ref 0–35)
AST: 13 U/L (ref 0–37)
Albumin: 4.3 g/dL (ref 3.5–5.2)
BILIRUBIN TOTAL: 0.5 mg/dL (ref 0.2–1.2)
BUN: 10 mg/dL (ref 6–23)
CO2: 31 mEq/L (ref 19–32)
CREATININE: 0.64 mg/dL (ref 0.40–1.20)
Calcium: 9.6 mg/dL (ref 8.4–10.5)
Chloride: 105 mEq/L (ref 96–112)
GFR: 101.08 mL/min (ref >60.00)
Glucose, Bld: 81 mg/dL (ref 70–99)
Potassium: 4.2 mEq/L (ref 3.5–5.1)
SODIUM: 142 meq/L (ref 135–145)
TOTAL PROTEIN: 6.6 g/dL (ref 6.0–8.3)

## 2018-05-11 LAB — LIPID PANEL
CHOLESTEROL: 144 mg/dL (ref 0–200)
HDL: 55.2 mg/dL (ref >39.00)
LDL Cholesterol: 72 mg/dL (ref 0–99)
NONHDL: 89.12
Total CHOL/HDL Ratio: 3
Triglycerides: 88 mg/dL (ref 0.0–149.0)
VLDL: 17.6 mg/dL (ref 0.0–40.0)

## 2018-05-11 LAB — TSH: TSH: 1.02 u[IU]/mL (ref 0.35–4.50)

## 2018-05-11 LAB — VITAMIN D 25 HYDROXY (VIT D DEFICIENCY, FRACTURES): VITD: 54.14 ng/mL (ref 30.00–100.00)

## 2018-05-11 NOTE — Progress Notes (Signed)
Subjective:  Patient ID: Regina Long, female    DOB: 02-Feb-1976  Age: 43 y.o. MRN: [id number]  CC: Annual Exam   HPI Regina Long presents for a CPX. She feels well. Offers no complaints.  Outpatient Medications Prior to Visit  Medication Sig Dispense Refill  . carisoprodol (SOMA) 350 MG tablet Take 175 mg by mouth at bedtime.     . DULoxetine (CYMBALTA) 60 MG capsule Take 30 mg by mouth at bedtime.     Marland Kitchen estazolam (PROSOM) 2 MG tablet Take 0.5 mg by mouth at bedtime.     Marland Kitchen morphine (MS CONTIN) 30 MG 12 hr tablet Take 15 mg by mouth 3 (three) times daily.     Marland Kitchen UNABLE TO FIND Med Name: [employer] Life Long Vitality Pack (xEOmega, AlphaCRS, Micro Plex VMz)     No facility-administered medications prior to visit.     ROS Review of Systems  Constitutional: Negative.  Negative for diaphoresis and fatigue.  HENT: Negative.   Eyes: Negative for visual disturbance.  Respiratory: Negative for cough, chest tightness, shortness of breath and wheezing.   Cardiovascular: Negative for chest pain, palpitations and leg swelling.  Gastrointestinal: Negative for abdominal pain, constipation, diarrhea, nausea and vomiting.  Genitourinary: Negative.  Negative for difficulty urinating.  Musculoskeletal: Negative.  Negative for arthralgias, back pain, myalgias and neck pain.  Skin: Negative.  Negative for color change.  Neurological: Negative.  Negative for dizziness.  Hematological: Negative for adenopathy. Does not bruise/bleed easily.  Psychiatric/Behavioral: Negative.     Objective:  BP 120/68 (BP Location: Left Arm, Patient Position: Sitting, Cuff Size: Normal)   Pulse 68   Temp 97.9 F (36.6 C) (Oral)   Ht 5\' 5"  (1.651 m)   Wt 126 lb (57.2 kg)   SpO2 98%   BMI 20.97 kg/m   BP Readings from Last 3 Encounters:  05/11/18 120/68  10/26/17 124/74  04/21/17 112/74    Wt Readings from Last 3 Encounters:  05/11/18 126 lb (57.2 kg)  10/26/17 123 lb 9.6 oz (56.1 kg)  04/21/17 129  lb (58.5 kg)    Physical Exam Vitals signs reviewed.  Constitutional:      Appearance: She is normal weight.  HENT:     Mouth/Throat:     Mouth: Mucous membranes are moist.     Pharynx: Oropharynx is clear. No oropharyngeal exudate.  Eyes:     General: No scleral icterus.    Conjunctiva/sclera: Conjunctivae normal.  Neck:     Musculoskeletal: Normal range of motion and neck supple.  Cardiovascular:     Rate and Rhythm: Normal rate and regular rhythm.     Heart sounds: No murmur. No gallop.   Pulmonary:     Effort: Pulmonary effort is normal.     Breath sounds: No stridor. No wheezing, rhonchi or rales.  Abdominal:     General: Bowel sounds are normal.     Palpations: There is no hepatomegaly, splenomegaly or mass.     Tenderness: There is no abdominal tenderness. There is no guarding.  Musculoskeletal: Normal range of motion.        General: No swelling.     Right lower leg: No edema.     Left lower leg: No edema.  Lymphadenopathy:     Cervical: No cervical adenopathy.  Skin:    General: Skin is warm and dry.  Neurological:     General: No focal deficit present.     Mental Status: She is oriented to person,  place, and time. Mental status is at baseline.  Psychiatric:        Mood and Affect: Mood normal.        Behavior: Behavior normal.        Thought Content: Thought content normal.        Judgment: Judgment normal.     Lab Results  Component Value Date   WBC 5.1 05/11/2018   HGB 12.9 05/11/2018   HCT 38.4 05/11/2018   PLT 192.0 05/11/2018   GLUCOSE 81 05/11/2018   CHOL 144 05/11/2018   TRIG 88.0 05/11/2018   HDL 55.20 05/11/2018   LDLCALC 72 05/11/2018   ALT 11 05/11/2018   AST 13 05/11/2018   NA 142 05/11/2018   K 4.2 05/11/2018   CL 105 05/11/2018   CREATININE 0.64 05/11/2018   BUN 10 05/11/2018   CO2 31 05/11/2018   TSH 1.02 05/11/2018    Mm Diag Breast Tomo Uni Right  Result Date: 04/26/2018 CLINICAL DATA:  Screening recall for possible mass  in the right breast. EXAM: DIGITAL DIAGNOSTIC UNILATERAL RIGHT MAMMOGRAM WITH CAD AND TOMO COMPARISON:  Previous exam(s). ACR Breast Density Category d: The breast tissue is extremely dense, which lowers the sensitivity of mammography. FINDINGS: On the diagnostic 3D images, the possible mass, which was suggested in the posterior upper outer quadrant, is no longer evident, consistent with it having been due to superimposed fibroglandular tissue. There is no evidence of a mass, no architectural distortion and no suspicious calcifications. Mammographic images were processed with CAD. IMPRESSION: No evidence of breast malignancy. RECOMMENDATION: Screening mammogram in one year.(Code:SM-B-01Y) I have discussed the findings and recommendations with the patient. Results were also provided in writing at the conclusion of the visit. If applicable, a reminder letter will be sent to the patient regarding the next appointment. BI-RADS CATEGORY  1: Negative. Electronically Signed   By: Lajean Manes M.D.   On: 04/26/2018 14:40    Assessment & Plan:   Regina Long was seen today for annual exam.  Diagnoses and all orders for this visit:  Routine general medical examination at a health care facility- Exam completed, labs reviewed, she refused a flu vaccine, Pap and mammogram are up-to-date per GYN, patient education material was given. -     Lipid panel; Future -     HIV Antibody (routine testing w rflx); Future  Fibromyalgia- This is managed by specialist out of [facility].  Her labs are normal indicating no complications. -     CBC with Differential/Platelet; Future -     Comprehensive metabolic panel; Future -     TSH; Future  Vitamin D deficiency- Her vitamin D level is in the normal range now. -     VITAMIN D 25 Hydroxy (Vit-D Deficiency, Fractures); Future   I am having Regina Long maintain her carisoprodol, estazolam, DULoxetine, morphine, and UNABLE TO FIND.  No orders of the defined types were placed  in this encounter.    Follow-up: Return if symptoms worsen or fail to improve.  Scarlette Calico, MD

## 2018-05-11 NOTE — Patient Instructions (Signed)

## 2018-05-12 LAB — HIV ANTIBODY (ROUTINE TESTING W REFLEX): HIV: NONREACTIVE

## 2018-12-12 ENCOUNTER — Encounter: Payer: Self-pay | Admitting: Internal Medicine

## 2018-12-18 ENCOUNTER — Telehealth: Payer: Self-pay | Admitting: Internal Medicine

## 2018-12-18 NOTE — Telephone Encounter (Signed)
Melissa is calling from Dr. [facility] requesting the most recent labs to be faxed. Please advise [phone]

## 2018-12-18 NOTE — Telephone Encounter (Signed)
Notes have been faxed and office contacted and informed of same.

## 2019-03-06 ENCOUNTER — Telehealth: Payer: Self-pay

## 2019-03-06 NOTE — Telephone Encounter (Signed)
Copied from [place] [id number]. Topic: General - Other >> Mar 06, 2019  3:06 PM Rainey Pines A wrote: Patient would like to do her lab work placed so that she can come in a different time to do her lab work. Please advise.

## 2019-03-06 NOTE — Telephone Encounter (Signed)
Are you okay with labs prior to appointment?

## 2019-03-09 ENCOUNTER — Other Ambulatory Visit: Payer: Self-pay | Admitting: Internal Medicine

## 2019-03-09 DIAGNOSIS — E559 Vitamin D deficiency, unspecified: Secondary | ICD-10-CM

## 2019-03-09 DIAGNOSIS — Z0000 Encounter for general adult medical examination without abnormal findings: Secondary | ICD-10-CM

## 2019-03-09 NOTE — Telephone Encounter (Signed)
Can you add the CBC, TSH and CMET lab? Pt is coming in to do labs in January.   Pt stated she goes to [facility]. I am sending records request to them for the results.

## 2019-03-09 NOTE — Telephone Encounter (Signed)
LABS ordered When was her last PAP?  Regina Long

## 2019-03-10 ENCOUNTER — Other Ambulatory Visit: Payer: Self-pay | Admitting: Internal Medicine

## 2019-03-10 DIAGNOSIS — G4719 Other hypersomnia: Secondary | ICD-10-CM

## 2019-03-10 DIAGNOSIS — M797 Fibromyalgia: Secondary | ICD-10-CM

## 2019-03-10 NOTE — Telephone Encounter (Signed)
Additional labs ordered as requested  TJ

## 2019-03-13 NOTE — Telephone Encounter (Signed)
Contacted pt and informed of same.

## 2019-04-03 ENCOUNTER — Other Ambulatory Visit: Payer: Self-pay | Admitting: Obstetrics and Gynecology

## 2019-04-03 DIAGNOSIS — N6310 Unspecified lump in the right breast, unspecified quadrant: Secondary | ICD-10-CM

## 2019-05-14 ENCOUNTER — Ambulatory Visit (INDEPENDENT_AMBULATORY_CARE_PROVIDER_SITE_OTHER): Payer: BC Managed Care – PPO | Admitting: Internal Medicine

## 2019-05-14 ENCOUNTER — Encounter: Payer: BLUE CROSS/BLUE SHIELD | Admitting: Internal Medicine

## 2019-05-14 ENCOUNTER — Encounter: Payer: Self-pay | Admitting: Internal Medicine

## 2019-05-14 ENCOUNTER — Other Ambulatory Visit: Payer: Self-pay

## 2019-05-14 VITALS — BP 124/80 | HR 79 | Temp 98.8°F | Ht 65.0 in | Wt 117.0 lb

## 2019-05-14 DIAGNOSIS — Z0000 Encounter for general adult medical examination without abnormal findings: Secondary | ICD-10-CM

## 2019-05-14 DIAGNOSIS — M797 Fibromyalgia: Secondary | ICD-10-CM

## 2019-05-14 DIAGNOSIS — E559 Vitamin D deficiency, unspecified: Secondary | ICD-10-CM

## 2019-05-14 NOTE — Progress Notes (Signed)
Subjective:  Patient ID: Regina Long, female    DOB: 08-27-1975  Age: 44 y.o. MRN: [id number]  CC: Annual Exam  This visit occurred during the SARS-CoV-2 public health emergency.  Safety protocols were in place, including screening questions prior to the visit, additional usage of staff PPE, and extensive cleaning of exam room while observing appropriate contact time as indicated for disinfecting solutions.    HPI Regina Long presents for a CPX.  She has intentionally lost weight recently with lifestyle modifications.  She tells me she is controlling her fibromyalgia symptoms with a combination of Soma, duloxetine, gabapentin, and morphine.  She tells me she is tolerating this regimen well with no side effects.  Outpatient Medications Prior to Visit  Medication Sig Dispense Refill  . carisoprodol (SOMA) 350 MG tablet Take 175 mg by mouth at bedtime.     . DULoxetine (CYMBALTA) 30 MG capsule duloxetine 30 mg capsule,delayed release    . estazolam (PROSOM) 2 MG tablet Take 0.5 mg by mouth at bedtime.     . gabapentin (NEURONTIN) 300 MG capsule Take 300 mg by mouth 3 (three) times daily.    Marland Kitchen morphine (MS CONTIN) 30 MG 12 hr tablet Take 15 mg by mouth 3 (three) times daily.     Marland Kitchen UNABLE TO FIND Med Name: [facility] Life Long Vitality Pack (xEOmega, AlphaCRS, Micro Plex VMz)    . DULoxetine (CYMBALTA) 60 MG capsule Take 30 mg by mouth at bedtime.      No facility-administered medications prior to visit.    ROS Review of Systems  Constitutional: Negative for diaphoresis, fatigue and unexpected weight change.  HENT: Negative.   Eyes: Negative.   Respiratory: Negative for cough, shortness of breath and wheezing.   Cardiovascular: Negative for chest pain, palpitations and leg swelling.  Gastrointestinal: Negative for abdominal pain, constipation, diarrhea, nausea and vomiting.  Endocrine: Negative.   Genitourinary: Negative.  Negative for difficulty urinating.  Musculoskeletal:  Positive for myalgias. Negative for arthralgias.  Skin: Negative for color change, pallor and rash.  Neurological: Negative.  Negative for dizziness, weakness, light-headedness and headaches.  Hematological: Negative for adenopathy. Does not bruise/bleed easily.  Psychiatric/Behavioral: Negative.     Objective:  BP 124/80 (BP Location: Left Arm, Patient Position: Sitting, Cuff Size: Normal)   Pulse 79   Temp 98.8 F (37.1 C) (Oral)   Ht 5\' 5"  (1.651 m)   Wt 117 lb (53.1 kg)   LMP 05/01/2019 Comment: irregular  SpO2 99%   BMI 19.47 kg/m   BP Readings from Last 3 Encounters:  05/14/19 124/80  05/11/18 120/68  10/26/17 124/74    Wt Readings from Last 3 Encounters:  05/14/19 117 lb (53.1 kg)  05/11/18 126 lb (57.2 kg)  10/26/17 123 lb 9.6 oz (56.1 kg)    Physical Exam Vitals reviewed.  HENT:     Nose: Nose normal.     Mouth/Throat:     Mouth: Mucous membranes are moist.  Eyes:     General: No scleral icterus.    Conjunctiva/sclera: Conjunctivae normal.  Cardiovascular:     Rate and Rhythm: Normal rate and regular rhythm.     Heart sounds: No murmur.  Pulmonary:     Effort: Pulmonary effort is normal.     Breath sounds: No stridor. No wheezing, rhonchi or rales.  Abdominal:     General: Abdomen is flat. Bowel sounds are normal. There is no distension.     Palpations: Abdomen is soft. There is no  hepatomegaly or splenomegaly.     Tenderness: There is no abdominal tenderness.  Musculoskeletal:        General: Normal range of motion.     Cervical back: Neck supple.     Right lower leg: No edema.     Left lower leg: No edema.  Lymphadenopathy:     Cervical: No cervical adenopathy.  Skin:    General: Skin is warm and dry.     Coloration: Skin is not pale.  Neurological:     General: No focal deficit present.     Mental Status: She is alert.  Psychiatric:        Mood and Affect: Mood normal.        Behavior: Behavior normal.     Lab Results  Component Value  Date   WBC 5.7 05/15/2019   HGB 13.0 05/15/2019   HCT 39.0 05/15/2019   PLT 183.0 05/15/2019   GLUCOSE 86 05/15/2019   CHOL 139 05/15/2019   TRIG 55.0 05/15/2019   HDL 59.60 05/15/2019   LDLCALC 69 05/15/2019   ALT 11 05/15/2019   AST 12 05/15/2019   NA 137 05/15/2019   K 3.9 05/15/2019   CL 104 05/15/2019   CREATININE 0.63 05/15/2019   BUN 12 05/15/2019   CO2 28 05/15/2019   TSH 1.30 05/15/2019    MM DIAG BREAST TOMO UNI RIGHT  Result Date: 04/26/2018 CLINICAL DATA:  Screening recall for possible mass in the right breast. EXAM: DIGITAL DIAGNOSTIC UNILATERAL RIGHT MAMMOGRAM WITH CAD AND TOMO COMPARISON:  Previous exam(s). ACR Breast Density Category d: The breast tissue is extremely dense, which lowers the sensitivity of mammography. FINDINGS: On the diagnostic 3D images, the possible mass, which was suggested in the posterior upper outer quadrant, is no longer evident, consistent with it having been due to superimposed fibroglandular tissue. There is no evidence of a mass, no architectural distortion and no suspicious calcifications. Mammographic images were processed with CAD. IMPRESSION: No evidence of breast malignancy. RECOMMENDATION: Screening mammogram in one year.(Code:SM-B-01Y) I have discussed the findings and recommendations with the patient. Results were also provided in writing at the conclusion of the visit. If applicable, a reminder letter will be sent to the patient regarding the next appointment. BI-RADS CATEGORY  1: Negative. Electronically Signed   By: Lajean Manes M.D.   On: 04/26/2018 14:40    Assessment & Plan:   Azel was seen today for annual exam.  Diagnoses and all orders for this visit:  Routine general medical examination at a health care facility-   Exam completed, labs reviewed, she refused a flu vaccine today, she has a mammogram scheduled within the next few weeks, cervical cancer screening is up-to-date, patient education was given.  Vitamin D  deficiency- Her vitamin D level is normal.  Fibromyalgia- Her symptoms are well controlled.  No complications noted.  Her labs are reassuring.   I am having Ronnald Collum maintain her carisoprodol, estazolam, morphine, UNABLE TO FIND, gabapentin, and DULoxetine.  No orders of the defined types were placed in this encounter.    Follow-up: No follow-ups on file.  Scarlette Calico, MD

## 2019-05-15 ENCOUNTER — Other Ambulatory Visit (INDEPENDENT_AMBULATORY_CARE_PROVIDER_SITE_OTHER): Payer: BC Managed Care – PPO

## 2019-05-15 ENCOUNTER — Encounter: Payer: Self-pay | Admitting: Internal Medicine

## 2019-05-15 DIAGNOSIS — Z0000 Encounter for general adult medical examination without abnormal findings: Secondary | ICD-10-CM | POA: Diagnosis not present

## 2019-05-15 DIAGNOSIS — G4719 Other hypersomnia: Secondary | ICD-10-CM | POA: Diagnosis not present

## 2019-05-15 DIAGNOSIS — E559 Vitamin D deficiency, unspecified: Secondary | ICD-10-CM

## 2019-05-15 DIAGNOSIS — M797 Fibromyalgia: Secondary | ICD-10-CM | POA: Diagnosis not present

## 2019-05-15 LAB — CBC WITH DIFFERENTIAL/PLATELET
Basophils Absolute: 0 10*3/uL (ref 0.0–0.1)
Basophils Relative: 0.2 % (ref 0.0–3.0)
Eosinophils Absolute: 0.1 10*3/uL (ref 0.0–0.7)
Eosinophils Relative: 1.8 % (ref 0.0–5.0)
HCT: 39 % (ref 36.0–46.0)
Hemoglobin: 13 g/dL (ref 12.0–15.0)
Lymphocytes Relative: 19.4 % (ref 12.0–46.0)
Lymphs Abs: 1.1 10*3/uL (ref 0.7–4.0)
MCHC: 33.2 g/dL (ref 30.0–36.0)
MCV: 94.1 fl (ref 78.0–100.0)
Monocytes Absolute: 0.5 10*3/uL (ref 0.1–1.0)
Monocytes Relative: 8.9 % (ref 3.0–12.0)
Neutro Abs: 4 10*3/uL (ref 1.4–7.7)
Neutrophils Relative %: 69.7 % (ref 43.0–77.0)
Platelets: 183 10*3/uL (ref 150.0–400.0)
RBC: 4.15 Mil/uL (ref 3.87–5.11)
RDW: 12.7 % (ref 11.5–15.5)
WBC: 5.7 10*3/uL (ref 4.0–10.5)

## 2019-05-15 LAB — HEPATIC FUNCTION PANEL
ALT: 11 U/L (ref 0–35)
AST: 12 U/L (ref 0–37)
Albumin: 4.4 g/dL (ref 3.5–5.2)
Alkaline Phosphatase: 38 U/L — ABNORMAL LOW (ref 39–117)
Bilirubin, Direct: 0.2 mg/dL (ref 0.0–0.3)
Total Bilirubin: 0.9 mg/dL (ref 0.2–1.2)
Total Protein: 6.8 g/dL (ref 6.0–8.3)

## 2019-05-15 LAB — LIPID PANEL
Cholesterol: 139 mg/dL (ref 0–200)
HDL: 59.6 mg/dL (ref >39.00)
LDL Cholesterol: 69 mg/dL (ref 0–99)
NonHDL: 79.5
Total CHOL/HDL Ratio: 2
Triglycerides: 55 mg/dL (ref 0.0–149.0)
VLDL: 11 mg/dL (ref 0.0–40.0)

## 2019-05-15 LAB — BASIC METABOLIC PANEL
BUN: 12 mg/dL (ref 6–23)
CO2: 28 mEq/L (ref 19–32)
Calcium: 9.6 mg/dL (ref 8.4–10.5)
Chloride: 104 mEq/L (ref 96–112)
Creatinine, Ser: 0.63 mg/dL (ref 0.40–1.20)
GFR: 102.46 mL/min (ref >60.00)
Glucose, Bld: 86 mg/dL (ref 70–99)
Potassium: 3.9 mEq/L (ref 3.5–5.1)
Sodium: 137 mEq/L (ref 135–145)

## 2019-05-15 LAB — TSH: TSH: 1.3 u[IU]/mL (ref 0.35–4.50)

## 2019-05-15 LAB — VITAMIN D 25 HYDROXY (VIT D DEFICIENCY, FRACTURES): VITD: 57.02 ng/mL (ref 30.00–100.00)

## 2019-05-16 ENCOUNTER — Encounter: Payer: Self-pay | Admitting: Internal Medicine

## 2019-05-16 NOTE — Patient Instructions (Signed)
Health Maintenance, Female Adopting a healthy lifestyle and getting preventive care are important in promoting health and wellness. Ask your health care provider about:  The right schedule for you to have regular tests and exams.  Things you can do on your own to prevent diseases and keep yourself healthy. What should I know about diet, weight, and exercise? Eat a healthy diet   Eat a diet that includes plenty of vegetables, fruits, low-fat dairy products, and lean protein.  Do not eat a lot of foods that are high in solid fats, added sugars, or sodium. Maintain a healthy weight Body mass index (BMI) is used to identify weight problems. It estimates body fat based on height and weight. Your health care provider can help determine your BMI and help you achieve or maintain a healthy weight. Get regular exercise Get regular exercise. This is one of the most important things you can do for your health. Most adults should:  Exercise for at least 150 minutes each week. The exercise should increase your heart rate and make you sweat (moderate-intensity exercise).  Do strengthening exercises at least twice a week. This is in addition to the moderate-intensity exercise.  Spend less time sitting. Even light physical activity can be beneficial. Watch cholesterol and blood lipids Have your blood tested for lipids and cholesterol at 45 years of age, then have this test every 5 years. Have your cholesterol levels checked more often if:  Your lipid or cholesterol levels are high.  You are older than 45 years of age.  You are at high risk for heart disease. What should I know about cancer screening? Depending on your health history and family history, you may need to have cancer screening at various ages. This may include screening for:  Breast cancer.  Cervical cancer.  Colorectal cancer.  Skin cancer.  Lung cancer. What should I know about heart disease, diabetes, and high blood  pressure? Blood pressure and heart disease  High blood pressure causes heart disease and increases the risk of stroke. This is more likely to develop in people who have high blood pressure readings, are of African descent, or are overweight.  Have your blood pressure checked: ? Every 3-5 years if you are 18-39 years of age. ? Every year if you are 40 years old or older. Diabetes Have regular diabetes screenings. This checks your fasting blood sugar level. Have the screening done:  Once every three years after age 40 if you are at a normal weight and have a low risk for diabetes.  More often and at a younger age if you are overweight or have a high risk for diabetes. What should I know about preventing infection? Hepatitis B If you have a higher risk for hepatitis B, you should be screened for this virus. Talk with your health care provider to find out if you are at risk for hepatitis B infection. Hepatitis C Testing is recommended for:  Everyone born from 1945 through 1965.  Anyone with known risk factors for hepatitis C. Sexually transmitted infections (STIs)  Get screened for STIs, including gonorrhea and chlamydia, if: ? You are sexually active and are younger than 45 years of age. ? You are older than 45 years of age and your health care provider tells you that you are at risk for this type of infection. ? Your sexual activity has changed since you were last screened, and you are at increased risk for chlamydia or gonorrhea. Ask your health care provider if   you are at risk.  Ask your health care provider about whether you are at high risk for HIV. Your health care provider may recommend a prescription medicine to help prevent HIV infection. If you choose to take medicine to prevent HIV, you should first get tested for HIV. You should then be tested every 3 months for as long as you are taking the medicine. Pregnancy  If you are about to stop having your period (premenopausal) and  you may become pregnant, seek counseling before you get pregnant.  Take 400 to 800 micrograms (mcg) of folic acid every day if you become pregnant.  Ask for birth control (contraception) if you want to prevent pregnancy. Osteoporosis and menopause Osteoporosis is a disease in which the bones lose minerals and strength with aging. This can result in bone fractures. If you are 65 years old or older, or if you are at risk for osteoporosis and fractures, ask your health care provider if you should:  Be screened for bone loss.  Take a calcium or vitamin D supplement to lower your risk of fractures.  Be given hormone replacement therapy (HRT) to treat symptoms of menopause. Follow these instructions at home: Lifestyle  Do not use any products that contain nicotine or tobacco, such as cigarettes, e-cigarettes, and chewing tobacco. If you need help quitting, ask your health care provider.  Do not use street drugs.  Do not share needles.  Ask your health care provider for help if you need support or information about quitting drugs. Alcohol use  Do not drink alcohol if: ? Your health care provider tells you not to drink. ? You are pregnant, may be pregnant, or are planning to become pregnant.  If you drink alcohol: ? Limit how much you use to 0-1 drink a day. ? Limit intake if you are breastfeeding.  Be aware of how much alcohol is in your drink. In the [place] one drink equals one 12 oz bottle of beer (355 mL), one 5 oz glass of wine (148 mL), or one 1 oz glass of hard liquor (44 mL). General instructions  Schedule regular health, dental, and eye exams.  Stay current with your vaccines.  Tell your health care provider if: ? You often feel depressed. ? You have ever been abused or do not feel safe at home. Summary  Adopting a healthy lifestyle and getting preventive care are important in promoting health and wellness.  Follow your health care provider's instructions about healthy  diet, exercising, and getting tested or screened for diseases.  Follow your health care provider's instructions on monitoring your cholesterol and blood pressure. This information is not intended to replace advice given to you by your health care provider. Make sure you discuss any questions you have with your health care provider. Document Revised: 03/29/2018 Document Reviewed: 03/29/2018 Elsevier Patient Education  2020 [employer]vier Inc.  

## 2019-12-03 ENCOUNTER — Telehealth: Payer: Self-pay | Admitting: Pulmonary Disease

## 2019-12-03 DIAGNOSIS — G4733 Obstructive sleep apnea (adult) (pediatric): Secondary | ICD-10-CM

## 2019-12-03 NOTE — Telephone Encounter (Signed)
DME order for CPAP mask and headgear placed and left for Dr. Halford Chessman to sign/fax. Patient encouraged to make a follow up appointment with office so we can continue to order her CPAP supplies.

## 2020-01-25 ENCOUNTER — Other Ambulatory Visit: Payer: Self-pay

## 2020-01-25 ENCOUNTER — Ambulatory Visit (INDEPENDENT_AMBULATORY_CARE_PROVIDER_SITE_OTHER): Payer: BC Managed Care – PPO | Admitting: Pulmonary Disease

## 2020-01-25 ENCOUNTER — Encounter: Payer: Self-pay | Admitting: Pulmonary Disease

## 2020-01-25 VITALS — BP 112/60 | HR 67 | Temp 97.3°F | Ht 65.0 in | Wt 121.4 lb

## 2020-01-25 DIAGNOSIS — F1190 Opioid use, unspecified, uncomplicated: Secondary | ICD-10-CM | POA: Diagnosis not present

## 2020-01-25 DIAGNOSIS — G4731 Primary central sleep apnea: Secondary | ICD-10-CM

## 2020-01-25 DIAGNOSIS — G4737 Central sleep apnea in conditions classified elsewhere: Secondary | ICD-10-CM

## 2020-01-25 NOTE — Patient Instructions (Signed)
Will have [facility] arrange for new [name] machine and refitting of your mask  You will need a follow up 3 to 4 months after you get your new [name] machine

## 2020-01-25 NOTE — Progress Notes (Signed)
[facility]  Chief Complaint  Patient presents with  . Follow-up    OSA    Constitutional:  BP 112/60 (BP Location: Left Arm, Cuff Size: Normal)   Pulse 67   Temp (!) 97.3 F (36.3 C) (Oral)   Ht 5\' 5"  (1.651 m)   Wt 121 lb 6.4 oz (55.1 kg)   SpO2 97%   BMI 20.20 kg/m   Past Medical History:  Chronic headaches, Fibromyalgia, Chronic pain, DJD, IBS, Memory difficulties  Past Surgical History:  Her  has a past surgical history that includes Tubal ligation and 3 laproscopies.  Brief Summary:  Regina Long is a 44 y.o. 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  She has history of chronic pain with chronic opiate medication use.      Subjective:   Last saw her in 2019.  Since then she has worked her way off several of her medications.  She is still taking several medications including MS contin.    She tries to go to sleep between 10 and midnight.  She can take about 1 hour to fall asleep.  She wakes up a few times during the night, and is restless at night especially between 4 am and 7 am.  She tries to get up around 8 am, but sometimes won't get up until about 10 am.  She still feels tired during the day, but avoids taking naps.  She is still using essential oils. She doesn't feel like the [name] machine is delivering appropriate pressures.  She still struggles with mask fit.  She is using a full face mask, but has to clamp the straps down on her face to prevent air leak.  As a result her mask lining wears out quickly.  Physical Exam:   Appearance - well kempt   ENMT - no sinus tenderness, no oral exudate, no LAN, Mallampati 4 airway, no stridor, enlarged tongue  Respiratory - equal breath sounds bilaterally, no wheezing or rales  CV - s1s2 regular rate and rhythm, no murmurs  Ext - no clubbing, no edema  Skin - no rashes  Psych - normal mood and affect   Sleep Tests:   PSG 12/05/11 >>AHI 16.4, SpO2 low 86% >>Central apnea index  11.1, Obstructive apnea index 1.2, Mixed apnea index 0.2, Hypopnea index 3.9. This is consistent with central sleep apnea.   ONO with CPAP and RA 05/01/12 >>Test time 6 hrs 19 min. Mean SpO2 97%, low SpO2 79%. Spent 12 sec with SpO2 <88%. Supplemental oxygen not indicated.   05/02/12 CPAP mask desensitization  [name] 07/17/16 to 10/14/16 >>used on 82 of 90 nights with average 5 hrs 9 min. Average AHI 1.4 with median IPAP 10 and 95 th percentile IPAP 14 cm H2O. Max PS 15, Min PS 3, EPAP 6 cm H2O.  Social History:  She  reports that she quit smoking about 15 years ago. She quit after 3.00 years of use. She has never used smokeless tobacco. She reports that she does not drink alcohol and does not use drugs.  Family History:  Her family history includes Cancer in an other family member; Heart attack in her maternal grandfather; Heart disease in an other family member; Prostate cancer in her father.     Assessment/Plan:   Complex sleep apnea. - she has obstructive sleep apnea, and central sleep apnea in setting of chronic opiate medication use - she was tried on CPAP therapy but had persistent central apneas associated with  oxygen desaturation - she has improved since being on [name] device - she is complaint with [name] and reports benefit from therapy - she uses [facility] for her DME - her machine is more than 45 yrs old, not functioning properly and not amenable to repair - will arrange for new [name] with max IPAP 16, min EPAP 6, and PS 4 cm H2O - will have [facility] refit her [name] mask  Time Spent Involved in Patient Care on Day of Examination:  25 minutes  Follow up:  Patient Instructions  Will have [facility] arrange for new [name] machine and refitting of your mask  You will need a follow up 3 to 4 months after you get your new [name] machine   Medication List:   Allergies as of 01/25/2020      Reactions   Influenza Vaccines Other (See Comments)   Flare up of fibromyalgia   Ritalin  [methylphenidate Hcl] Other (See Comments)   Pt states that this medication "knocked her out".        Medication List       Accurate as of January 25, 2020 12:44 PM. If you have any questions, ask your nurse or doctor.        STOP taking these medications   carisoprodol 350 MG tablet Commonly known as: SOMA Stopped by: Chesley Mires, MD     TAKE these medications   DULoxetine 30 MG capsule Commonly known as: CYMBALTA duloxetine 30 mg capsule,delayed release   estazolam 2 MG tablet Commonly known as: PROSOM Take 0.5 mg by mouth at bedtime.   gabapentin 300 MG capsule Commonly known as: NEURONTIN Take 300 mg by mouth 2 (two) times daily.   Mirena (52 MG) 20 MCG/24HR IUD Generic drug: levonorgestrel Mirena 20 mcg/24 hours (7 yrs) 52 mg intrauterine device  Take 1 device by intrauterine route.   morphine 30 MG 12 hr tablet Commonly known as: MS CONTIN Take 15 mg by mouth 3 (three) times daily.   UNABLE TO FIND Med Name: [facility] Vitality Pack (xEOmega, AlphaCRS, Micro Plex VMz)       Signature:  Chesley Mires, MD [place] Pager - [phone] 01/25/2020, 12:44 PM

## 2020-02-22 ENCOUNTER — Telehealth: Payer: Self-pay | Admitting: Pulmonary Disease

## 2020-02-22 DIAGNOSIS — G4731 Primary central sleep apnea: Secondary | ICD-10-CM

## 2020-02-22 NOTE — Telephone Encounter (Signed)
Order sent to [facility] and I called and spoke with the pt and notified her that this was done. Nothing further needed.

## 2020-02-22 NOTE — Telephone Encounter (Signed)
Spoke with Larkin Ina, RT with [facility]  He checked pt's [name] device and it is working properly and her DL was "perfect"  He states pt is having the subjective feeling that she is not getting enough air  He is suggesting that we increase the pressure support  Please advise, thanks

## 2020-02-22 NOTE — Telephone Encounter (Signed)
Okay to change pressure support setting to 6 cm H2O.

## 2020-02-28 LAB — BASIC METABOLIC PANEL
BUN: 9 (ref 4–21)
Creatinine: 0.7 (ref 0.5–1.1)
Glucose: 88

## 2020-02-28 LAB — LIPID PANEL
Cholesterol: 150 (ref 0–200)
HDL: 57 (ref 35–70)
LDL Cholesterol: 78
Triglycerides: 81 (ref 40–160)

## 2020-02-28 LAB — TSH: TSH: 1.22 (ref 0.41–5.90)

## 2020-04-30 ENCOUNTER — Other Ambulatory Visit: Payer: Self-pay | Admitting: Obstetrics and Gynecology

## 2020-04-30 DIAGNOSIS — N6320 Unspecified lump in the left breast, unspecified quadrant: Secondary | ICD-10-CM

## 2020-05-14 ENCOUNTER — Ambulatory Visit
Admission: RE | Admit: 2020-05-14 | Discharge: 2020-05-14 | Disposition: A | Discharge status: Home / Self Care | Payer: BC Managed Care – PPO | Source: Ambulatory Visit | Attending: Obstetrics and Gynecology | Admitting: Obstetrics and Gynecology

## 2020-05-14 ENCOUNTER — Other Ambulatory Visit: Payer: Self-pay

## 2020-05-14 ENCOUNTER — Other Ambulatory Visit: Payer: Self-pay | Admitting: Obstetrics and Gynecology

## 2020-05-14 DIAGNOSIS — N6310 Unspecified lump in the right breast, unspecified quadrant: Secondary | ICD-10-CM

## 2020-05-14 DIAGNOSIS — N6320 Unspecified lump in the left breast, unspecified quadrant: Secondary | ICD-10-CM

## 2020-06-05 IMAGING — MG DIGITAL DIAGNOSTIC UNILATERAL RIGHT MAMMOGRAM WITH TOMO AND CAD
4 series · 4 of 12 positions shown · non-contrast
Comparison: Previous exam(s).

CLINICAL DATA: Screening recall for possible mass in the right
breast.

EXAM:
DIGITAL DIAGNOSTIC UNILATERAL RIGHT MAMMOGRAM WITH CAD AND TOMO

[R MLO synth-2D]
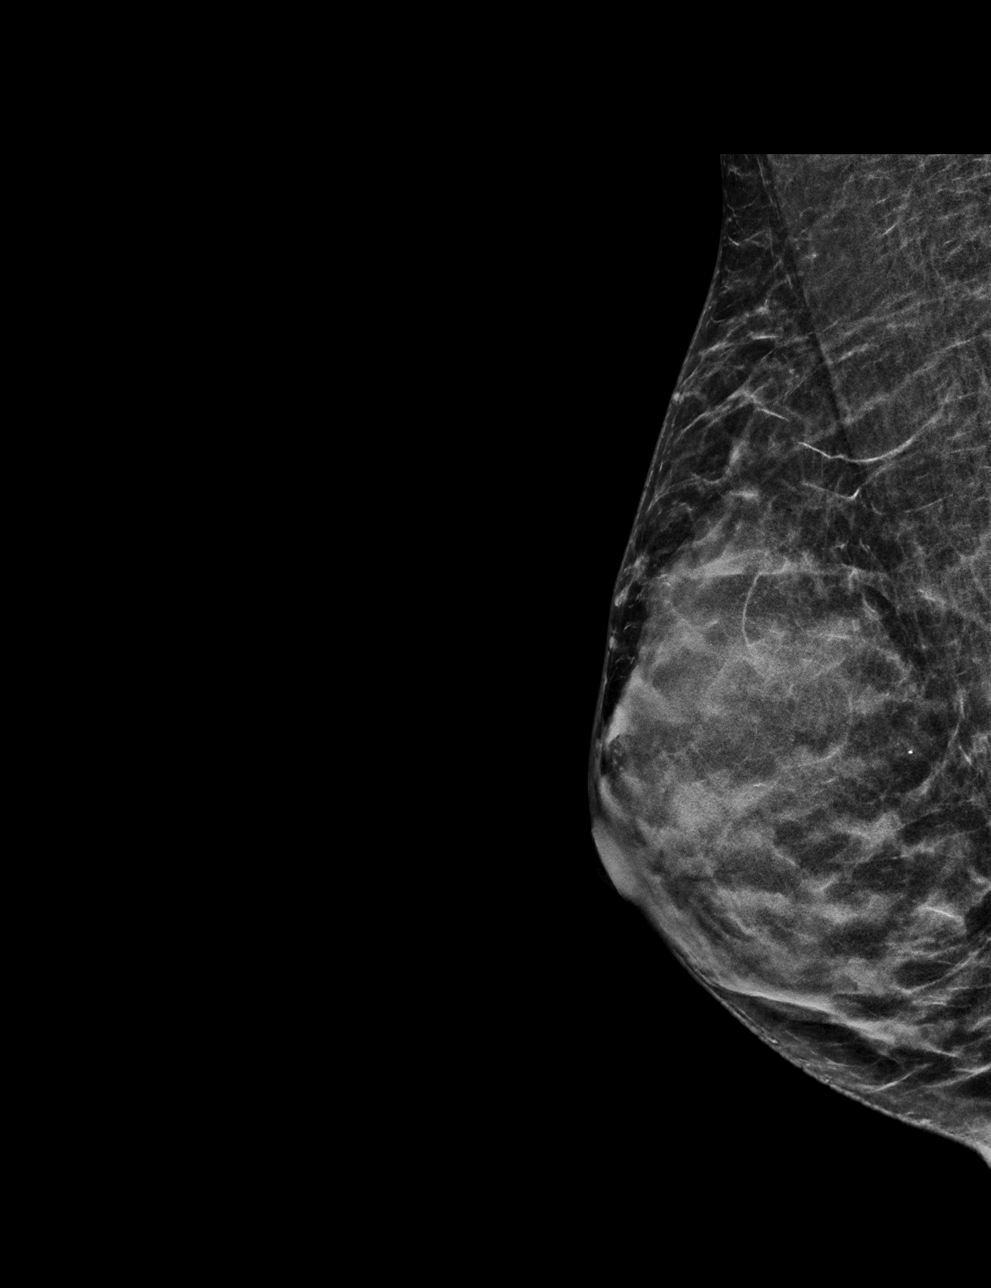

[R CC synth-2D]
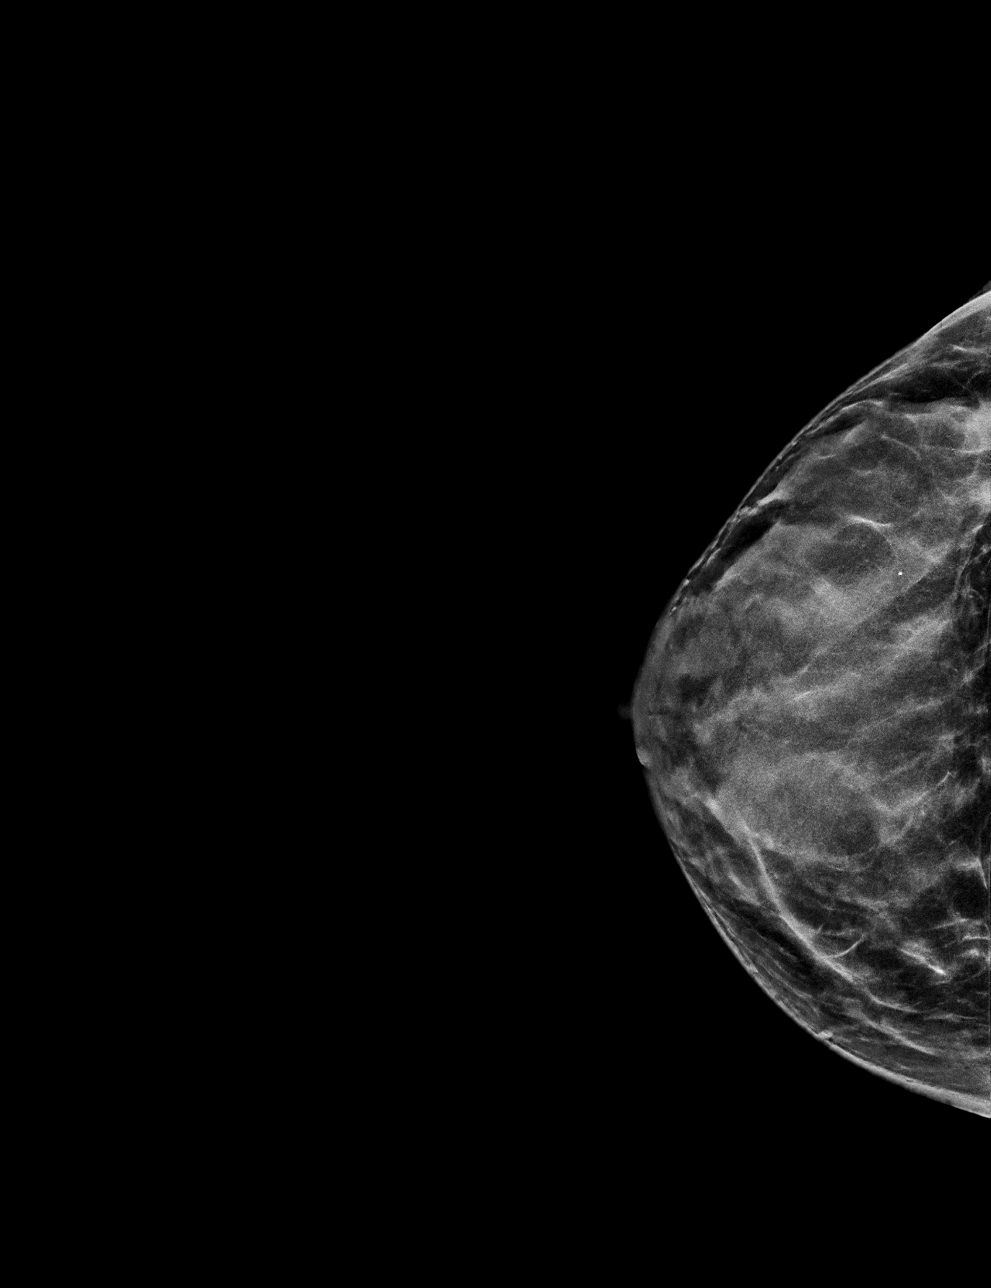

[R MLO tomo · tomo slice 23/46.0]
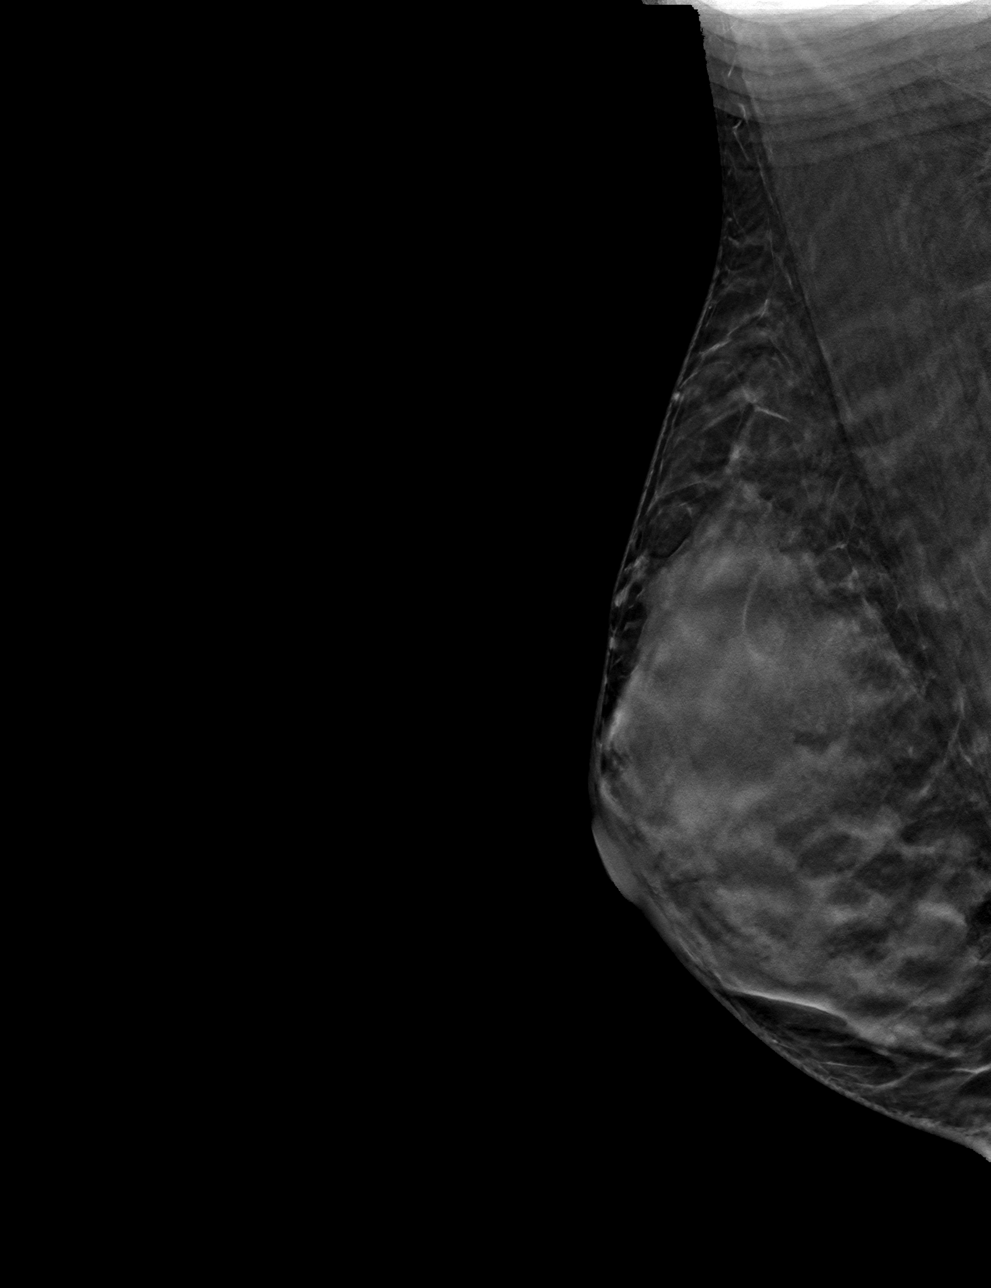

[R CC tomo · tomo slice 25/50.0]
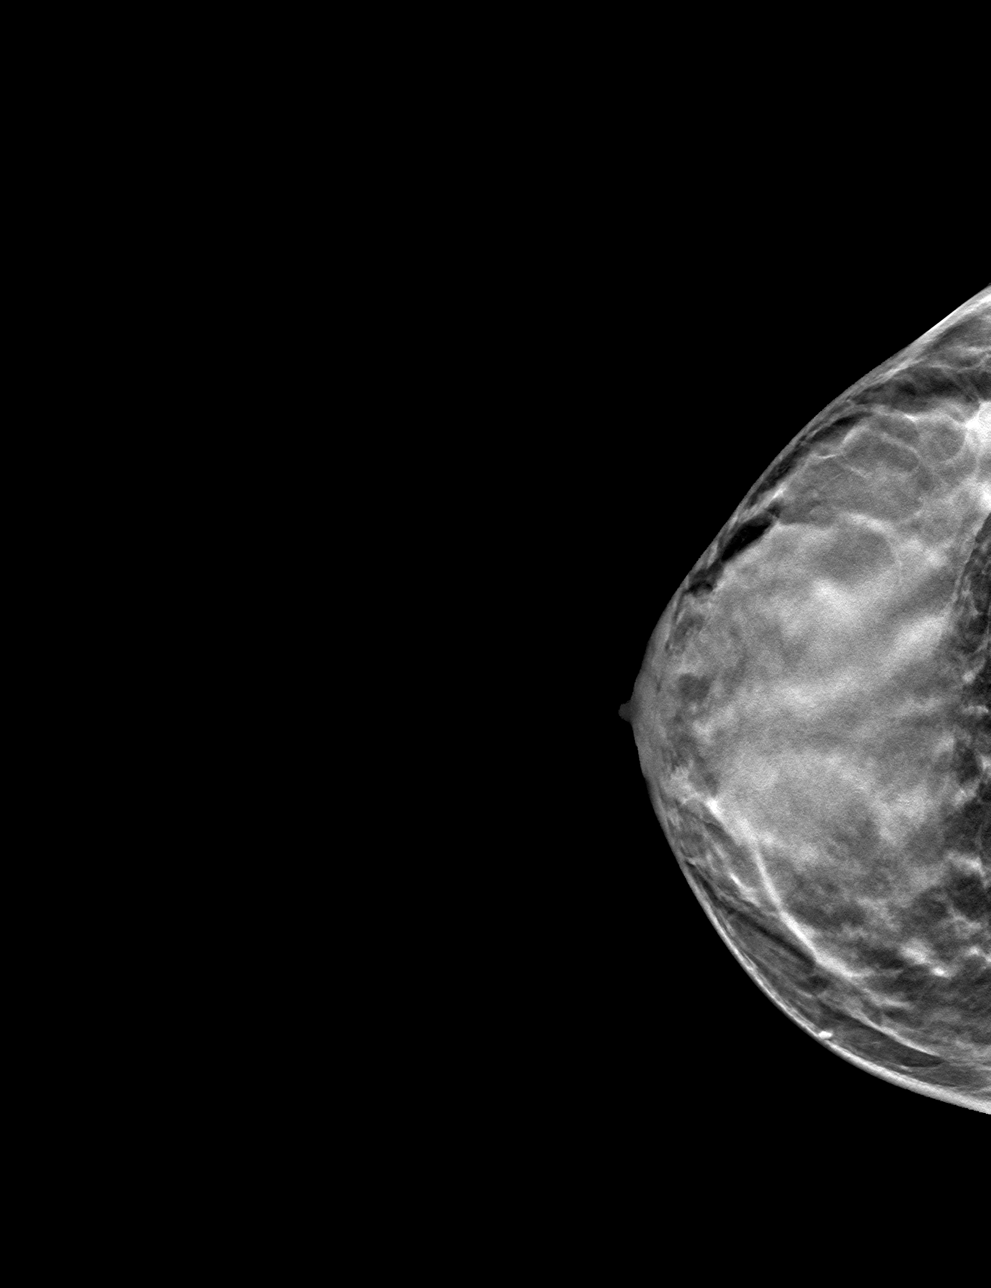

[4 of 12 positions shown; findings below may reference images not displayed]

ACR Breast Density Category d: The breast tissue is extremely dense,
which lowers the sensitivity of mammography.
FINDINGS: On the diagnostic 3D images, the possible mass, which was suggested
in the posterior upper outer quadrant, is no longer evident,
consistent with it having been due to superimposed fibroglandular
tissue. There is no evidence of a mass, no architectural distortion
and no suspicious calcifications.

Mammographic images were processed with CAD.
IMPRESSION: No evidence of breast malignancy.

RECOMMENDATION:
Screening mammogram in one year.(Code:MX-[id number])

I have discussed the findings and recommendations with the patient.
Results were also provided in writing at the conclusion of the
visit. If applicable, a reminder letter will be sent to the patient
regarding the next appointment.

BI-RADS CATEGORY  1: Negative.

## 2020-08-04 ENCOUNTER — Telehealth: Payer: Self-pay | Admitting: Pulmonary Disease

## 2020-08-04 DIAGNOSIS — G4733 Obstructive sleep apnea (adult) (pediatric): Secondary | ICD-10-CM

## 2020-08-04 NOTE — Telephone Encounter (Signed)
Called and spoke with Patient. Dr. Juanetta Gosling recommendations given. Understanding stated. Mask fitting order placed. Nothing further at this time.

## 2020-08-04 NOTE — Telephone Encounter (Signed)
Please send order to have mask refitting done at sleep lab.

## 2020-08-04 NOTE — Telephone Encounter (Signed)
Primary Pulmonologist: Dr Halford Chessman Last office visit and with whom: 01/25/2020 with Dr Halford Chessman What do we see them for (pulmonary problems): Complex sleep apnea syndrome & Central sleep apnea comorbid with prescribed opioid use Last OV assessment/plan: Complex sleep apnea. - she has obstructive sleep apnea, and central sleep apnea in setting of chronic opiate medication use - she was tried on CPAP therapy but had persistent central apneas associated with oxygen desaturation - she has improved since being on [name] device - she is complaint with [name] and reports benefit from therapy - she uses [facility] for her DME - her machine is more than 47 yrs old, not functioning properly and not amenable to repair - will arrange for new [name] with max IPAP 16, min EPAP 6, and PS 4 cm H2O - will have [facility] refit her [name] mask     Reason for call: Patient states she would like order for pediatric cpap mask or to have a mask fit test to get the best mask for her to fit and seal.  Patient states she already has the smallest adult mask currently which is not working well and its a struggle to get a full seal. Patient stated she did not have any kind of follow up from [facility] refitting of mask from last visit.   DME: Adult pediatric specialist/[facility]  Dr Halford Chessman please advise     Allergies  Allergen Reactions  . Influenza Vaccines Other (See Comments)    Flare up of fibromyalgia  . Ritalin [Methylphenidate Hcl] Other (See Comments)    Pt states that this medication "knocked her out".      Immunization History  Administered Date(s) Administered  . Tdap 02/03/2011

## 2020-08-13 ENCOUNTER — Telehealth: Payer: Self-pay | Admitting: Internal Medicine

## 2020-08-13 NOTE — Telephone Encounter (Signed)
Patient request her lab results from 01.26.21 be sent to Dr. Sharlene Dory  Fax- [phone]

## 2020-08-13 NOTE — Telephone Encounter (Signed)
Labs have been faxed.

## 2020-09-03 ENCOUNTER — Other Ambulatory Visit: Payer: Self-pay

## 2020-09-03 ENCOUNTER — Ambulatory Visit (HOSPITAL_BASED_OUTPATIENT_CLINIC_OR_DEPARTMENT_OTHER): Payer: BC Managed Care – PPO | Attending: Pulmonary Disease | Admitting: Pulmonary Disease

## 2020-09-03 DIAGNOSIS — G4733 Obstructive sleep apnea (adult) (pediatric): Secondary | ICD-10-CM

## 2020-09-08 ENCOUNTER — Telehealth: Payer: Self-pay | Admitting: Pulmonary Disease

## 2020-09-08 NOTE — Telephone Encounter (Signed)
Spoke with the pt She states DME-[facility] fit for her for a new mask- airfit f30 She thought it was fine and then woke up with blister around her nose  She states that it's okay while she is sitting up wearing it, but at night when lies down it bothers her  She has went back to her old mask, which does not seal well and slides up her face  She states that she thinks she needs pediatric mask, but sleep center does not have them and she has not been able to find a dme that does  Please advise thanks!

## 2020-09-08 NOTE — Telephone Encounter (Signed)
She should continue using her old mask for now.  She can look up mask options at [employer]  She can do filter search for masks best for smaller sized faces or kids masks.  If she finds a mask she would like to try, then I can send an order to her DME to see if this can be set up through them.  Otherwise, I could write a prescription for her to get the CPAP mask directly from [email or web address].

## 2020-09-08 NOTE — Telephone Encounter (Signed)
Called and spoke with pt and she is aware of VS recs.  She will call back with the mask that she would like to try so we can get that order sent to the DME/

## 2020-09-08 NOTE — Telephone Encounter (Signed)
Pt went to sleep center for mask fit. They did not have pediatric masks(pt has very small head) Pt states she tried the mask and now she has blisters on nose.Pt has had troubles with mask, went to old mask. Sleep center advises to see Dr Halford Chessman. Please [phone]

## 2020-09-10 ENCOUNTER — Ambulatory Visit (INDEPENDENT_AMBULATORY_CARE_PROVIDER_SITE_OTHER): Payer: BC Managed Care – PPO | Admitting: Internal Medicine

## 2020-09-10 ENCOUNTER — Encounter: Payer: Self-pay | Admitting: Internal Medicine

## 2020-09-10 ENCOUNTER — Telehealth: Payer: Self-pay | Admitting: Pulmonary Disease

## 2020-09-10 ENCOUNTER — Other Ambulatory Visit: Payer: Self-pay

## 2020-09-10 VITALS — BP 100/62 | HR 65 | Temp 98.2°F | Ht 65.0 in | Wt 121.0 lb

## 2020-09-10 DIAGNOSIS — M797 Fibromyalgia: Secondary | ICD-10-CM | POA: Diagnosis not present

## 2020-09-10 DIAGNOSIS — Z23 Encounter for immunization: Secondary | ICD-10-CM | POA: Diagnosis not present

## 2020-09-10 DIAGNOSIS — E559 Vitamin D deficiency, unspecified: Secondary | ICD-10-CM

## 2020-09-10 DIAGNOSIS — Z1211 Encounter for screening for malignant neoplasm of colon: Secondary | ICD-10-CM

## 2020-09-10 DIAGNOSIS — Z0000 Encounter for general adult medical examination without abnormal findings: Secondary | ICD-10-CM

## 2020-09-10 DIAGNOSIS — G4731 Primary central sleep apnea: Secondary | ICD-10-CM

## 2020-09-10 NOTE — Telephone Encounter (Signed)
Called and left message on voicemail to please return phone call. Contact number provided. 

## 2020-09-10 NOTE — Progress Notes (Signed)
Subjective:  Patient ID: Regina Long, female    DOB: Apr 25, 1975  Age: 45 y.o. MRN: [id number]  CC: Annual Exam  This visit occurred during the SARS-CoV-2 public health emergency.  Safety protocols were in place, including screening questions prior to the visit, additional usage of staff PPE, and extensive cleaning of exam room while observing appropriate contact time as indicated for disinfecting solutions.    HPI BINTA STATZER presents for a CPX and f/up -  She tells me that her fibromyalgia pain has been well controlled recently.  Outpatient Medications Prior to Visit  Medication Sig Dispense Refill  . DULoxetine (CYMBALTA) 30 MG capsule duloxetine 30 mg capsule,delayed release    . estazolam (PROSOM) 2 MG tablet Take 2 mg by mouth at bedtime.    . gabapentin (NEURONTIN) 300 MG capsule Take 300 mg by mouth 2 (two) times daily.     Marland Kitchen morphine (MS CONTIN) 30 MG 12 hr tablet Take 15 mg by mouth 3 (three) times daily.     Marland Kitchen UNABLE TO FIND Med Name: [facility] Life Long Vitality Pack (xEOmega, AlphaCRS, Micro Plex VMz)    . levonorgestrel (MIRENA, 52 MG,) 20 MCG/24HR IUD Mirena 20 mcg/24 hours (7 yrs) 52 mg intrauterine device  Take 1 device by intrauterine route.     No facility-administered medications prior to visit.    ROS Review of Systems  Constitutional: Negative.  Negative for appetite change, diaphoresis, fatigue and unexpected weight change.  HENT: Negative.   Eyes: Negative.   Respiratory: Negative for cough, chest tightness, shortness of breath and wheezing.   Cardiovascular: Negative for chest pain, palpitations and leg swelling.  Gastrointestinal: Negative for abdominal pain, constipation, diarrhea, nausea and vomiting.  Endocrine: Negative.   Genitourinary: Negative for difficulty urinating.  Musculoskeletal: Positive for myalgias. Negative for arthralgias and neck pain.  Skin: Negative.  Negative for color change.  Neurological: Negative.  Negative for dizziness,  weakness, light-headedness, numbness and headaches.  Hematological: Negative for adenopathy. Does not bruise/bleed easily.  Psychiatric/Behavioral: Negative.     Objective:  BP 100/62 (BP Location: Left Arm, Patient Position: Sitting, Cuff Size: Normal)   Pulse 65   Temp 98.2 F (36.8 C) (Oral)   Ht 5\' 5"  (1.651 m)   Wt 121 lb (54.9 kg)   SpO2 99%   BMI 20.14 kg/m   BP Readings from Last 3 Encounters:  09/10/20 100/62  01/25/20 112/60  05/14/19 124/80    Wt Readings from Last 3 Encounters:  09/10/20 121 lb (54.9 kg)  01/25/20 121 lb 6.4 oz (55.1 kg)  05/14/19 117 lb (53.1 kg)    Physical Exam Vitals reviewed.  HENT:     Nose: Nose normal.     Mouth/Throat:     Mouth: Mucous membranes are moist.  Eyes:     General: No scleral icterus.    Conjunctiva/sclera: Conjunctivae normal.  Cardiovascular:     Rate and Rhythm: Normal rate and regular rhythm.     Heart sounds: No murmur heard.   Pulmonary:     Effort: Pulmonary effort is normal.     Breath sounds: No stridor. No wheezing, rhonchi or rales.  Abdominal:     General: Abdomen is flat. Bowel sounds are normal. There is no distension.     Palpations: Abdomen is soft. There is no hepatomegaly, splenomegaly or mass.     Tenderness: There is no abdominal tenderness.  Musculoskeletal:        General: Normal range of motion.  Cervical back: Neck supple.     Right lower leg: No edema.     Left lower leg: No edema.  Lymphadenopathy:     Cervical: No cervical adenopathy.  Skin:    General: Skin is warm and dry.     Coloration: Skin is not pale.  Neurological:     General: No focal deficit present.     Mental Status: She is alert.  Psychiatric:        Mood and Affect: Mood normal.        Behavior: Behavior normal.        Thought Content: Thought content normal.        Judgment: Judgment normal.     Lab Results  Component Value Date   WBC 7.8 09/10/2020   HGB 12.7 09/10/2020   HCT 37.8 09/10/2020   PLT  183.0 09/10/2020   GLUCOSE 87 09/10/2020   CHOL 150 02/28/2020   TRIG 81 02/28/2020   HDL 57 02/28/2020   LDLCALC 78 02/28/2020   ALT 19 09/10/2020   AST 18 09/10/2020   NA 140 09/10/2020   K 4.1 09/10/2020   CL 103 09/10/2020   CREATININE 0.62 09/10/2020   BUN 9 09/10/2020   CO2 32 09/10/2020   TSH 1.22 02/28/2020    US BREAST LTD UNI LEFT INC AXILLA  Result Date: 05/14/2020 CLINICAL DATA:  Patient presents for palpable abnormalities within the right and left breast. EXAM: DIGITAL DIAGNOSTIC BILATERAL MAMMOGRAM WITH CAD AND TOMOSYNTHESIS ULTRASOUND BILATERAL BREAST TECHNIQUE: Bilateral digital diagnostic mammography and breast tomosynthesis was performed. Digital images of the breasts were evaluated with computer-aided detection. Targeted ultrasound examination of the Bilateral breast was performed. COMPARISON:  Previous exam(s). ACR Breast Density Category d: The breast tissue is extremely dense, which lowers the sensitivity of mammography. FINDINGS: Markedly dense breast tissue. There is a small oval circumscribed mass within the upper inner right breast. Additional partially obscured masses are identified within the breast bilaterally at the sites of palpable concern. Targeted ultrasound is performed, showing a 2.5 x 2.1 x 1.3 cm cyst left breast 2 o'clock position 2 cm from nipple. There is an adjacent 0.8 x 0.4 x 0.9 cm probable complicated cyst left breast 2 o'clock position 2 cm from nipple. There is a 2.3 x 2.2 x 1.2 cm cyst left breast 8 o'clock position 2 cm from nipple. There is a 0.6 x 0.5 x 0.3 cm cyst right breast 9:30 o'clock 1 cm from the nipple. There is a 0.5 x 0.3 x 0.5 cm cyst right breast 1 o'clock position 2 cm from nipple. IMPRESSION: Palpable abnormalities within the right and left breast most compatible with cysts. Within the left breast 2 o'clock position 2 cm from nipple there is a 9 mm probable complicated cyst. RECOMMENDATION: Left breast ultrasound in 6 months to  reassess the probably benign left breast mass 2 o'clock position 2 cm from nipple favored to represent a complicated cyst. Continued clinical evaluation for bilateral palpable areas of concern. I have discussed the findings and recommendations with the patient. If applicable, a reminder letter will be sent to the patient regarding the next appointment. BI-RADS CATEGORY  3: Probably benign. Electronically Signed   By: Lovey Newcomer M.D.   On: 05/14/2020 16:25   US BREAST LTD UNI RIGHT INC AXILLA  Result Date: 05/14/2020 CLINICAL DATA:  Patient presents for palpable abnormalities within the right and left breast. EXAM: DIGITAL DIAGNOSTIC BILATERAL MAMMOGRAM WITH CAD AND TOMOSYNTHESIS ULTRASOUND BILATERAL BREAST TECHNIQUE: Bilateral  digital diagnostic mammography and breast tomosynthesis was performed. Digital images of the breasts were evaluated with computer-aided detection. Targeted ultrasound examination of the Bilateral breast was performed. COMPARISON:  Previous exam(s). ACR Breast Density Category d: The breast tissue is extremely dense, which lowers the sensitivity of mammography. FINDINGS: Markedly dense breast tissue. There is a small oval circumscribed mass within the upper inner right breast. Additional partially obscured masses are identified within the breast bilaterally at the sites of palpable concern. Targeted ultrasound is performed, showing a 2.5 x 2.1 x 1.3 cm cyst left breast 2 o'clock position 2 cm from nipple. There is an adjacent 0.8 x 0.4 x 0.9 cm probable complicated cyst left breast 2 o'clock position 2 cm from nipple. There is a 2.3 x 2.2 x 1.2 cm cyst left breast 8 o'clock position 2 cm from nipple. There is a 0.6 x 0.5 x 0.3 cm cyst right breast 9:30 o'clock 1 cm from the nipple. There is a 0.5 x 0.3 x 0.5 cm cyst right breast 1 o'clock position 2 cm from nipple. IMPRESSION: Palpable abnormalities within the right and left breast most compatible with cysts. Within the left breast 2  o'clock position 2 cm from nipple there is a 9 mm probable complicated cyst. RECOMMENDATION: Left breast ultrasound in 6 months to reassess the probably benign left breast mass 2 o'clock position 2 cm from nipple favored to represent a complicated cyst. Continued clinical evaluation for bilateral palpable areas of concern. I have discussed the findings and recommendations with the patient. If applicable, a reminder letter will be sent to the patient regarding the next appointment. BI-RADS CATEGORY  3: Probably benign. Electronically Signed   By: Lovey Newcomer M.D.   On: 05/14/2020 16:25   MM DIAG BREAST TOMO BILATERAL  Result Date: 05/14/2020 CLINICAL DATA:  Patient presents for palpable abnormalities within the right and left breast. EXAM: DIGITAL DIAGNOSTIC BILATERAL MAMMOGRAM WITH CAD AND TOMOSYNTHESIS ULTRASOUND BILATERAL BREAST TECHNIQUE: Bilateral digital diagnostic mammography and breast tomosynthesis was performed. Digital images of the breasts were evaluated with computer-aided detection. Targeted ultrasound examination of the Bilateral breast was performed. COMPARISON:  Previous exam(s). ACR Breast Density Category d: The breast tissue is extremely dense, which lowers the sensitivity of mammography. FINDINGS: Markedly dense breast tissue. There is a small oval circumscribed mass within the upper inner right breast. Additional partially obscured masses are identified within the breast bilaterally at the sites of palpable concern. Targeted ultrasound is performed, showing a 2.5 x 2.1 x 1.3 cm cyst left breast 2 o'clock position 2 cm from nipple. There is an adjacent 0.8 x 0.4 x 0.9 cm probable complicated cyst left breast 2 o'clock position 2 cm from nipple. There is a 2.3 x 2.2 x 1.2 cm cyst left breast 8 o'clock position 2 cm from nipple. There is a 0.6 x 0.5 x 0.3 cm cyst right breast 9:30 o'clock 1 cm from the nipple. There is a 0.5 x 0.3 x 0.5 cm cyst right breast 1 o'clock position 2 cm from nipple.  IMPRESSION: Palpable abnormalities within the right and left breast most compatible with cysts. Within the left breast 2 o'clock position 2 cm from nipple there is a 9 mm probable complicated cyst. RECOMMENDATION: Left breast ultrasound in 6 months to reassess the probably benign left breast mass 2 o'clock position 2 cm from nipple favored to represent a complicated cyst. Continued clinical evaluation for bilateral palpable areas of concern. I have discussed the findings and recommendations with the patient.  If applicable, a reminder letter will be sent to the patient regarding the next appointment. BI-RADS CATEGORY  3: Probably benign. Electronically Signed   By: Lovey Newcomer M.D.   On: 05/14/2020 16:25    Assessment & Plan:   Kayron was seen today for annual exam.  Diagnoses and all orders for this visit:  Routine general medical examination at a health care facility- Exam completed, labs reviewed, vaccines reviewed and updated, cancer screenings addressed, patient education was given. -     Hepatitis C antibody; Future -     Hepatitis C antibody  Fibromyalgia- Her symptoms are well controlled.  Her labs are reassuring. -     CBC with Differential/Platelet; Future -     Basic metabolic panel; Future -     Hepatic function panel; Future -     Hepatic function panel -     Basic metabolic panel -     CBC with Differential/Platelet  Vitamin D deficiency- Her vitamin D level is normal now. -     Basic metabolic panel; Future -     VITAMIN D 25 Hydroxy (Vit-D Deficiency, Fractures); Future -     Hepatic function panel; Future -     Hepatic function panel -     VITAMIN D 25 Hydroxy (Vit-D Deficiency, Fractures) -     Basic metabolic panel  Colon cancer screening -     Cologuard  Need for immunization against tetanus alone -     Tdap vaccine greater than or equal to 7yo IM   I have discontinued Zenia Resides Fukuda's Mirena (52 MG). I am also having her maintain her estazolam, morphine,  UNABLE TO FIND, gabapentin, and DULoxetine.  No orders of the defined types were placed in this encounter.    Follow-up: Return in about 6 months (around 03/13/2021).  Scarlette Calico, MD

## 2020-09-10 NOTE — Telephone Encounter (Signed)
Called and spoke with patient and went over Dr Juanetta Gosling recommendations. Patient expressed full understanding.   Patient would like to know if it's ok with Dr Halford Chessman to order new CPAP machine since patient realizes there is a back order and it could take awhile to get a new one. Patient states her current machine is about 46 years old and patient worried that machine may stop working before she would be able to get a new one. Patient states the machine is currently working but would like new one.  Scheduled patient for office visit on 10/15/20 with Dr Halford Chessman at the [facility] office.   Dr Halford Chessman please advise on if ok to place order for new CPAP.

## 2020-09-10 NOTE — Telephone Encounter (Signed)
Called and spoke with patient who stated pt calling because she got onto [email or web address] & was unable to find a cpap mask. Pt states they dont carry full face pedatric mask.  Patient states the F&P Evora full face mask looks similar to what Lynnae Sandhoff gave patient at the sleep center that worked (AirFit F 30) but the nasal part gave patient blisters with the AirFit F 30 and the F&P Evora does not have that nasal component to it and patient is interested in possibly trying that mask but this is not a pediatric mask and patient would prefer a pediatric mask if there is a place to get it. Patient would like Dr Halford Chessman advice as to is there any other place to get pediatric masks since [email or web address] does not carry them.  Dr Halford Chessman please advise.

## 2020-09-10 NOTE — Telephone Encounter (Signed)
There aren't many good options for pediatric masks.  She might be better off looking at masks designed for smaller faces.

## 2020-09-10 NOTE — Patient Instructions (Signed)
Health Maintenance, Female Adopting a healthy lifestyle and getting preventive care are important in promoting health and wellness. Ask your health care provider about:  The right schedule for you to have regular tests and exams.  Things you can do on your own to prevent diseases and keep yourself healthy. What should I know about diet, weight, and exercise? Eat a healthy diet  Eat a diet that includes plenty of vegetables, fruits, low-fat dairy products, and lean protein.  Do not eat a lot of foods that are high in solid fats, added sugars, or sodium.   Maintain a healthy weight Body mass index (BMI) is used to identify weight problems. It estimates body fat based on height and weight. Your health care provider can help determine your BMI and help you achieve or maintain a healthy weight. Get regular exercise Get regular exercise. This is one of the most important things you can do for your health. Most adults should:  Exercise for at least 150 minutes each week. The exercise should increase your heart rate and make you sweat (moderate-intensity exercise).  Do strengthening exercises at least twice a week. This is in addition to the moderate-intensity exercise.  Spend less time sitting. Even light physical activity can be beneficial. Watch cholesterol and blood lipids Have your blood tested for lipids and cholesterol at 46 years of age, then have this test every 5 years. Have your cholesterol levels checked more often if:  Your lipid or cholesterol levels are high.  You are older than 46 years of age.  You are at high risk for heart disease. What should I know about cancer screening? Depending on your health history and family history, you may need to have cancer screening at various ages. This may include screening for:  Breast cancer.  Cervical cancer.  Colorectal cancer.  Skin cancer.  Lung cancer. What should I know about heart disease, diabetes, and high blood  pressure? Blood pressure and heart disease  High blood pressure causes heart disease and increases the risk of stroke. This is more likely to develop in people who have high blood pressure readings, are of African descent, or are overweight.  Have your blood pressure checked: ? Every 3-5 years if you are 18-39 years of age. ? Every year if you are 40 years old or older. Diabetes Have regular diabetes screenings. This checks your fasting blood sugar level. Have the screening done:  Once every three years after age 40 if you are at a normal weight and have a low risk for diabetes.  More often and at a younger age if you are overweight or have a high risk for diabetes. What should I know about preventing infection? Hepatitis B If you have a higher risk for hepatitis B, you should be screened for this virus. Talk with your health care provider to find out if you are at risk for hepatitis B infection. Hepatitis C Testing is recommended for:  Everyone born from 1945 through 1965.  Anyone with known risk factors for hepatitis C. Sexually transmitted infections (STIs)  Get screened for STIs, including gonorrhea and chlamydia, if: ? You are sexually active and are younger than 46 years of age. ? You are older than 46 years of age and your health care provider tells you that you are at risk for this type of infection. ? Your sexual activity has changed since you were last screened, and you are at increased risk for chlamydia or gonorrhea. Ask your health care provider   if you are at risk.  Ask your health care provider about whether you are at high risk for HIV. Your health care provider may recommend a prescription medicine to help prevent HIV infection. If you choose to take medicine to prevent HIV, you should first get tested for HIV. You should then be tested every 3 months for as long as you are taking the medicine. Pregnancy  If you are about to stop having your period (premenopausal) and  you may become pregnant, seek counseling before you get pregnant.  Take 400 to 800 micrograms (mcg) of folic acid every day if you become pregnant.  Ask for birth control (contraception) if you want to prevent pregnancy. Osteoporosis and menopause Osteoporosis is a disease in which the bones lose minerals and strength with aging. This can result in bone fractures. If you are 65 years old or older, or if you are at risk for osteoporosis and fractures, ask your health care provider if you should:  Be screened for bone loss.  Take a calcium or vitamin D supplement to lower your risk of fractures.  Be given hormone replacement therapy (HRT) to treat symptoms of menopause. Follow these instructions at home: Lifestyle  Do not use any products that contain nicotine or tobacco, such as cigarettes, e-cigarettes, and chewing tobacco. If you need help quitting, ask your health care provider.  Do not use street drugs.  Do not share needles.  Ask your health care provider for help if you need support or information about quitting drugs. Alcohol use  Do not drink alcohol if: ? Your health care provider tells you not to drink. ? You are pregnant, may be pregnant, or are planning to become pregnant.  If you drink alcohol: ? Limit how much you use to 0-1 drink a day. ? Limit intake if you are breastfeeding.  Be aware of how much alcohol is in your drink. In the [place] one drink equals one 12 oz bottle of beer (355 mL), one 5 oz glass of wine (148 mL), or one 1 oz glass of hard liquor (44 mL). General instructions  Schedule regular health, dental, and eye exams.  Stay current with your vaccines.  Tell your health care provider if: ? You often feel depressed. ? You have ever been abused or do not feel safe at home. Summary  Adopting a healthy lifestyle and getting preventive care are important in promoting health and wellness.  Follow your health care provider's instructions about healthy  diet, exercising, and getting tested or screened for diseases.  Follow your health care provider's instructions on monitoring your cholesterol and blood pressure. This information is not intended to replace advice given to you by your health care provider. Make sure you discuss any questions you have with your health care provider. Document Revised: 03/29/2018 Document Reviewed: 03/29/2018 Elsevier Patient Education  2021 Elsevier Inc.  

## 2020-09-11 ENCOUNTER — Encounter: Payer: Self-pay | Admitting: Internal Medicine

## 2020-09-11 LAB — BASIC METABOLIC PANEL
BUN: 9 mg/dL (ref 6–23)
CO2: 32 mEq/L (ref 19–32)
Calcium: 9.4 mg/dL (ref 8.4–10.5)
Chloride: 103 mEq/L (ref 96–112)
Creatinine, Ser: 0.62 mg/dL (ref 0.40–1.20)
GFR: 107.3 mL/min (ref >60.00)
Glucose, Bld: 87 mg/dL (ref 70–99)
Potassium: 4.1 mEq/L (ref 3.5–5.1)
Sodium: 140 mEq/L (ref 135–145)

## 2020-09-11 LAB — CBC WITH DIFFERENTIAL/PLATELET
Basophils Absolute: 0.1 10*3/uL (ref 0.0–0.1)
Basophils Relative: 0.7 % (ref 0.0–3.0)
Eosinophils Absolute: 0.2 10*3/uL (ref 0.0–0.7)
Eosinophils Relative: 2.4 % (ref 0.0–5.0)
HCT: 37.8 % (ref 36.0–46.0)
Hemoglobin: 12.7 g/dL (ref 12.0–15.0)
Lymphocytes Relative: 27 % (ref 12.0–46.0)
Lymphs Abs: 2.1 10*3/uL (ref 0.7–4.0)
MCHC: 33.6 g/dL (ref 30.0–36.0)
MCV: 94.4 fl (ref 78.0–100.0)
Monocytes Absolute: 0.6 10*3/uL (ref 0.1–1.0)
Monocytes Relative: 7.5 % (ref 3.0–12.0)
Neutro Abs: 4.9 10*3/uL (ref 1.4–7.7)
Neutrophils Relative %: 62.4 % (ref 43.0–77.0)
Platelets: 183 10*3/uL (ref 150.0–400.0)
RBC: 4 Mil/uL (ref 3.87–5.11)
RDW: 12.5 % (ref 11.5–15.5)
WBC: 7.8 10*3/uL (ref 4.0–10.5)

## 2020-09-11 LAB — HEPATIC FUNCTION PANEL
ALT: 19 U/L (ref 0–35)
AST: 18 U/L (ref 0–37)
Albumin: 4.3 g/dL (ref 3.5–5.2)
Alkaline Phosphatase: 38 U/L — ABNORMAL LOW (ref 39–117)
Bilirubin, Direct: 0.1 mg/dL (ref 0.0–0.3)
Total Bilirubin: 0.6 mg/dL (ref 0.2–1.2)
Total Protein: 6.6 g/dL (ref 6.0–8.3)

## 2020-09-11 LAB — HEPATITIS C ANTIBODY
Hepatitis C Ab: NONREACTIVE
SIGNAL TO CUT-OFF: 0.01 (ref <1.00)

## 2020-09-11 LAB — VITAMIN D 25 HYDROXY (VIT D DEFICIENCY, FRACTURES): VITD: 40.08 ng/mL (ref 30.00–100.00)

## 2020-09-12 NOTE — Telephone Encounter (Signed)
Pt returning a phone call. Pt can be reached at [phone]. Pt is ok with a detailed message on her vmail as she will tied up for while.

## 2020-09-12 NOTE — Procedures (Signed)
    Presented for CPAP mask refitting.

## 2020-09-12 NOTE — Telephone Encounter (Signed)
There was order for new [name] device placed on 01/25/20 with [facility].  Please see what happened with this order.  If a new order is needed, then please order [name] with max IPAP 16 cm H2O, min EPAP 6 cm H2O, and pressure support 6 cm H2O.  Use diagnosis code for complex sleep apnea.

## 2020-09-12 NOTE — Telephone Encounter (Signed)
Called and spoke with Patient.  Patient wanted to know if she needed to anything further with DME or would Dr. Halford Chessman place cpap order, supplies, and mask.  Advised message had been sent to Dr. Halford Chessman and someone will call to let her know order has been placed with DME.

## 2020-09-23 ENCOUNTER — Telehealth: Payer: Self-pay | Admitting: Pulmonary Disease

## 2020-09-23 DIAGNOSIS — G4731 Primary central sleep apnea: Secondary | ICD-10-CM

## 2020-09-23 NOTE — Telephone Encounter (Signed)
I called and spoke with patient regarding cpap mask. Patient stated they have not received mask yet. I saw where there was another encounter talking about a mask but not where an order was sent. Patient also wanted to put in an order for a new CPAP machine as it has been 7 years and patient feels that machine is not working as well. Looks like there was an order sent in 10/21 but patient declined it in Jan 2022 due to machine working well. I went ahead and sent in order for mask and new cpap with listed pressures from Dr. Halford Chessman on 09/10/20. Patient verbalized understanding, nothing further needed.

## 2020-09-24 NOTE — Telephone Encounter (Signed)
PCC's the order from yesterday is for pt to have an [name] per Dr. Halford Chessman. Please advise if the DME can use this order. There was an order from 01/25/2020 that shouldve been the original set up for her.

## 2020-09-25 NOTE — Telephone Encounter (Signed)
New [name] order placed for [facility] with complex sleep apnea diagnosis.  Nothing further at this time.

## 2020-09-25 NOTE — Telephone Encounter (Signed)
Called [facility] and spoke w/ Karna Christmas and she advised that a new order for [name] would need to be placed.

## 2020-10-03 ENCOUNTER — Telehealth: Payer: Self-pay | Admitting: Pulmonary Disease

## 2020-10-03 DIAGNOSIS — G4733 Obstructive sleep apnea (adult) (pediatric): Secondary | ICD-10-CM

## 2020-10-03 NOTE — Telephone Encounter (Signed)
Called and spoke with Patient.  Patient stated she has been waiting on cpap mask that ordered earlier this week by Dr. Halford Chessman. Patient has been seen by Lynnae Sandhoff at sleep lab, and looked through [email or web address] looking for cpap mask per Dr. Juanetta Gosling recommendations.  Patient is going out of town and would like new cpap from [facility] as soon as possible Mask requested is F&P Evora mask.  Placed order for requested cpap mask to [facility], since there has been orders placed for mask of choice.

## 2020-10-15 ENCOUNTER — Ambulatory Visit: Payer: BC Managed Care – PPO | Admitting: Pulmonary Disease

## 2020-11-12 ENCOUNTER — Other Ambulatory Visit: Payer: BC Managed Care – PPO

## 2020-11-24 ENCOUNTER — Telehealth: Payer: Self-pay | Admitting: Pulmonary Disease

## 2020-11-24 NOTE — Telephone Encounter (Signed)
Patient scheduled for follow up (31-90 days) after receiving new Bipap machine, September 26th at 10:30 am, advised to arrive by 10:15 am for check in.  She verified understanding.  Nothing further needed.

## 2020-11-24 NOTE — Telephone Encounter (Signed)
Called and spoke with patient regarding Bipap machine.  She states it has stop working, no air flowing and no water being used in the humidification bottle.  States she has had her Bipap for 7 years, she was hoping it would last until she could get her new Bipap.  She was up all night last night without her [place] working.  States she has not heard from [facility] since July.  Advised I would call [facility] and then would call her back and give her an update.    Called and spoke with Sunnie Nielsen with [employer], he will look up her account and will call me back.  Heard back from [facility], he states they have a Bipap in the office and they have her scheduled for 11/25/20 at 11:30 am.    Called patient and verified that she is scheduled to go to [facility] and get the new Bipap machine.  Nothing further needed.

## 2020-11-25 ENCOUNTER — Telehealth: Payer: Self-pay | Admitting: Pulmonary Disease

## 2020-11-25 NOTE — Telephone Encounter (Signed)
Okay to use old settings.

## 2020-11-25 NOTE — Telephone Encounter (Signed)
Called and spoke with Melissa. I provided her with the verbal for the RX. She verbalized understanding and stated that she would send over the CMN for VS to sign. She was hopeful that the patient's insurance would have approved the machine this morning before 1030am but so far they have not heard back from them. She will reach back out to the patient once the insurance has approved the replacement machine.   Nothing further needed at time of call.

## 2020-11-25 NOTE — Telephone Encounter (Signed)
Regina Long from [facility] calling b/c VS wrote the order for [name] incorrectly. They cannot set pressure on machine the way he wrote the order, order needs to be fixed. They can set the pressure with the old settings if they have a verbal order.Pt is coming to [place] today at [place]. Please advise [phone]

## 2020-11-25 NOTE — Telephone Encounter (Signed)
Called and spoke with Melissa. She stated that the patient has an appt with [facility] this morning to receive her new [name]. The settings that were submitted to them back in June 2022 can not be used (epap 6cm, ipap 16cm, ps 6cm). She stated that the setting must be maximum pressure, mininum pressure and epap. Per Lenna Sciara, her old machine was sat at maximum pressure: 15cm, minimum pressure: 3cm and epap: 6cm.   She stated that we can give her a verbal if VS is ok with these settings.   VS, please advise if you are ok with these settings. Thanks!

## 2020-11-27 ENCOUNTER — Telehealth: Payer: Self-pay | Admitting: Pulmonary Disease

## 2020-11-27 DIAGNOSIS — G4731 Primary central sleep apnea: Secondary | ICD-10-CM

## 2020-11-27 NOTE — Telephone Encounter (Signed)
Pt stated that according to [facility] this morning that they had sent the form over to [place] and was waiting for Dr. Halford Chessman to sign the form so she is wanting to follow up to see if it has been signed and sent back to them. Pls regard; [phone].

## 2020-11-27 NOTE — Telephone Encounter (Signed)
LMTCB  This has been addressed.

## 2020-11-27 NOTE — Telephone Encounter (Signed)
No form located on pod or upfront. Will await to hear back from [facility] to see if they have form.

## 2020-11-27 NOTE — Telephone Encounter (Signed)
[name] can you see if you have received anything on Regina Long from [place] about her CPAP.  Thanks

## 2020-11-27 NOTE — Telephone Encounter (Signed)
Patient states [facility] has not received order for CPAP machine. Patient phone number is [phone].

## 2020-11-28 NOTE — Telephone Encounter (Signed)
Patient checking on order for BIPAP. Patient phone number is [phone].

## 2020-12-01 NOTE — Telephone Encounter (Addendum)
Spoke to Terri w/ [facility] who advised that Regina Long, she faxed the CMN to Chan's number on 8/12 with the corrections & all Dr. Halford Chessman needed to do was sign it.  [facility] states they are waiting on the CMN.  [name] did you do anything with this or not?

## 2020-12-01 NOTE — Telephone Encounter (Signed)
I have not received this CMN

## 2020-12-02 ENCOUNTER — Encounter: Payer: Self-pay | Admitting: Internal Medicine

## 2020-12-02 NOTE — Telephone Encounter (Signed)
Hello Dr. Halford Chessman, please advise on mychart message below, thanks!  Hello, Dr. Halford Chessman.  I pray this finds you well.  I am curious if you are aware of the status to the [name] CPAP machine prescription that needs to be processed.  I also need to speak to you about this transaction.  It is too much to try and type out.  I asked for an appointment ASAP and I was given Sept 21st.  Please contact me as soon as you are available to review some concerns and questions surrounding the needed prescription. Thank you, in advance, for your time and speedy attention to this matter!

## 2020-12-03 NOTE — Telephone Encounter (Signed)
Hello Dr. Halford Chessman, please advise on mychart message below, thanks!  Iam having some difficulty with the new machine.  I was not given a heated tubing because it was not part of the verbal prescription...which I need.   The machine keeps getting water up into the tubing.  I do NOT fill it to the fill line because the DME warned me of this, but it is still doing it.   I had increased the humidity, because it was on as high as possible on my last machine because I stay so dry.   I have lowered it back down, thinking this might be part of the problem for this machine...and again, it's leaking and gurgling and water in in the tubing, still??   I am not sure why the doctor went with a different machine altogether, but so far, this one is a bit confusing and leaking.   It would be very helpful if I could speak to Dr. Halford Chessman to try and understand the decisions that were made.   I did figure out how to adjust the pressure, because it was NOT enough, at all.  I think, I have that part figured out, but need additional assistance.   Again, thank you in advance for your help!

## 2020-12-03 NOTE — Telephone Encounter (Signed)
Spoke with patient.  She had trouble getting order in place for new [name] after her previous device stopped working.  She was told there is a verbal order with DME, but no official order yet.  I will place order now.  She also has been getting rain out.  She didn't get a heated tube for her [name].  Will place order to see if she can get this set up.  In the meantime advised she can use a towel to insulate her tube.  Order for [name] with max pressure 15 cm, pressure 3 cm, EPAP 6 cm.  Will get copy of her download and then determine if she needs to have further adjustment to her set up.

## 2020-12-04 NOTE — Telephone Encounter (Signed)
Order was placed yesterday & [name] sent that order to [place].

## 2020-12-10 ENCOUNTER — Other Ambulatory Visit: Payer: Self-pay | Admitting: Internal Medicine

## 2020-12-10 DIAGNOSIS — L989 Disorder of the skin and subcutaneous tissue, unspecified: Secondary | ICD-10-CM | POA: Insufficient documentation

## 2021-01-07 ENCOUNTER — Ambulatory Visit (INDEPENDENT_AMBULATORY_CARE_PROVIDER_SITE_OTHER): Payer: BC Managed Care – PPO | Admitting: Pulmonary Disease

## 2021-01-07 ENCOUNTER — Encounter: Payer: Self-pay | Admitting: Pulmonary Disease

## 2021-01-07 ENCOUNTER — Other Ambulatory Visit: Payer: Self-pay

## 2021-01-07 VITALS — BP 110/60 | HR 95 | Temp 98.8°F | Ht 65.0 in | Wt 120.8 lb

## 2021-01-07 DIAGNOSIS — G4737 Central sleep apnea in conditions classified elsewhere: Secondary | ICD-10-CM

## 2021-01-07 DIAGNOSIS — G4731 Primary central sleep apnea: Secondary | ICD-10-CM

## 2021-01-07 DIAGNOSIS — F1190 Opioid use, unspecified, uncomplicated: Secondary | ICD-10-CM | POA: Diagnosis not present

## 2021-01-07 DIAGNOSIS — G4733 Obstructive sleep apnea (adult) (pediatric): Secondary | ICD-10-CM | POA: Diagnosis not present

## 2021-01-07 NOTE — Progress Notes (Signed)
[name]  Chief Complaint  Patient presents with   Follow-up    Wears BIPAP-mask is still sliding down, top of head is bruised    Constitutional:  BP 110/60 (BP Location: Left Arm, Cuff Size: Normal)   Pulse 95   Temp 98.8 F (37.1 C) (Temporal)   Ht 5\' 5"  (1.651 m)   Wt 120 lb 12.8 oz (54.8 kg)   SpO2 100%   BMI 20.10 kg/m   Past Medical History:  Chronic headaches, Fibromyalgia, Chronic pain, DJD, IBS, Memory difficulties  Past Surgical History:  Her  has a past surgical history that includes Tubal ligation and 3 laproscopies.  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  She has history of chronic pain with chronic opiate medication use.      Subjective:   Her machine broke in August.  She is still very concerned about why it took so long for her to get a replacement machine, and why there wasn't better communication with her about what the process was at the time.  She feels the pressure setting is okay at present.  Her main issue with set up continues to be mask fit.  She feels that adult size masks are too big.  She was told at some point (she doesn't recall by whom or when) that she had to use a full face mask.  This has limited her ability to look into pediatric sized masks.  Physical Exam:   Appearance - well kempt   ENMT - no sinus tenderness, no oral exudate, no LAN, Mallampati 4 airway, no stridor, enlarged tongue  Respiratory - equal breath sounds bilaterally, no wheezing or rales  CV - s1s2 regular rate and rhythm, no murmurs  Ext - no clubbing, no edema  Skin - no rashes  Psych - normal mood and affect   Sleep Tests:  PSG 12/05/11 >> AHI 16.4, SpO2 low 86% >> Central apnea index 11.1, Obstructive apnea index 1.2, Mixed apnea index 0.2, Hypopnea index 3.9. This is consistent with central sleep apnea.  ONO with CPAP and RA 05/01/12 >> Test time 6 hrs 19 min. Mean SpO2  97%, low SpO2 79%. Spent 12 sec with SpO2 < 88%. Supplemental oxygen not indicated.  05/02/12 CPAP mask desensitization [name] 12/06/20 to 01/04/21 >> used on 30 of 30 nights with average 8 hrs 3 min.  Average AHI 1.1 with median IPAP 12, EPAP 7, PS 4 cm H2O.  Social History:  She  reports that she quit smoking about 16 years ago. Her smoking use included cigarettes. She has never used smokeless tobacco. She reports that she does not drink alcohol and does not use drugs.  Family History:  Her family history includes Cancer in an other family member; Heart attack in her maternal grandfather; Heart disease in an other family member; Prostate cancer in her father.     Assessment/Plan:   Complex sleep apnea. - she has obstructive sleep apnea, and central sleep apnea in setting of chronic opiate medication use - she was tried on CPAP therapy but had persistent central apneas associated with oxygen desaturation - she has improved since being on [name] device - she is complaint with [name] and reports benefit from therapy - she uses [facility] for her DME - continue [name] with max PS 17, min PS 4, EPAP 7 cm H2O  - discussed different mask fit options and explained she is not required  to use a full face mask; she will look up pediatric size mask options on line and notify our office if she finds one she would like to try - will have her try using a chin strap with her current mask in the meantime - if she still has difficulty after this, then might need to lower max PS on her [name] - had lengthy discussion with her about her concerns regarding arranging for replacement [name] in August, and addressed all her concern as best able  Time Spent Involved in Patient Care on Day of Examination:  64 minutes  Follow up:   Patient Instructions  Can look up CPAP mask options on line at [email or web address] or similar website  Will arrange for chin strap  Follow up in 6 months  Medication List:   Allergies as of 01/07/2021        Reactions   Influenza Vaccines Other (See Comments)   Flare up of fibromyalgia   Ritalin [methylphenidate Hcl] Other (See Comments)   Pt states that this medication "knocked her out".          Medication List        Accurate as of January 07, 2021  2:30 PM. If you have any questions, ask your nurse or doctor.          DULoxetine 30 MG capsule Commonly known as: CYMBALTA duloxetine 30 mg capsule,delayed release   estazolam 2 MG tablet Commonly known as: PROSOM Take 2 mg by mouth at bedtime.   gabapentin 300 MG capsule Commonly known as: NEURONTIN Take 300 mg by mouth 2 (two) times daily.   morphine 30 MG 12 hr tablet Commonly known as: MS CONTIN Take 15 mg by mouth 3 (three) times daily.   UNABLE TO FIND Med Name: [facility] Life Long Vitality Pack (xEOmega, AlphaCRS, Micro Plex VMz)        Signature:  Chesley Mires, MD [place] Pager - [phone] 01/07/2021, 2:30 PM

## 2021-01-07 NOTE — Patient Instructions (Signed)
Can look up CPAP mask options on line at [email or web address] or similar website  Will arrange for chin strap  Follow up in 6 months

## 2021-01-12 ENCOUNTER — Ambulatory Visit: Payer: BC Managed Care – PPO | Admitting: Pulmonary Disease

## 2021-01-27 DIAGNOSIS — G4731 Primary central sleep apnea: Secondary | ICD-10-CM

## 2021-01-27 NOTE — Telephone Encounter (Signed)
VS please advise. Thanks  [facility], Dr. Halford Chessman.  I apologize for the delay, but my husband and I agreed to wait until my replacement arrived before ordering the new mask.   The one I think I would like to try is the Resmed Pixi Pediatric Mask,   Resmed PixiT Pediatric Mask and [place] [id number]   Hopefully, this will work...!   Thank you,  Jerrell Mylar

## 2021-01-28 NOTE — Telephone Encounter (Signed)
Please let her know I have sent order to [place] for Resmed Pixi Pediatric Mask, and Resmed PixiT Pediatric Mask and [place] [id number].

## 2021-02-05 NOTE — Telephone Encounter (Signed)
Routing to VS as an FYI 

## 2021-02-06 NOTE — Telephone Encounter (Signed)
I have sent a message to [place] to contact the patient

## 2021-03-20 ENCOUNTER — Encounter: Payer: Self-pay | Admitting: Pulmonary Disease

## 2021-03-20 DIAGNOSIS — Z789 Other specified health status: Secondary | ICD-10-CM

## 2021-03-20 DIAGNOSIS — G4731 Primary central sleep apnea: Secondary | ICD-10-CM

## 2021-03-20 NOTE — Telephone Encounter (Signed)
VS please advise. Thanks! 

## 2021-03-20 NOTE — Telephone Encounter (Signed)
Please let her know that I have placed an order to get her mask refit at the sleep lab.

## 2021-04-06 ENCOUNTER — Ambulatory Visit (INDEPENDENT_AMBULATORY_CARE_PROVIDER_SITE_OTHER): Payer: BC Managed Care – PPO | Admitting: Dermatology

## 2021-04-06 ENCOUNTER — Encounter: Payer: Self-pay | Admitting: Dermatology

## 2021-04-06 ENCOUNTER — Other Ambulatory Visit: Payer: Self-pay

## 2021-04-06 DIAGNOSIS — L821 Other seborrheic keratosis: Secondary | ICD-10-CM | POA: Diagnosis not present

## 2021-04-06 DIAGNOSIS — Z1283 Encounter for screening for malignant neoplasm of skin: Secondary | ICD-10-CM

## 2021-04-06 DIAGNOSIS — L820 Inflamed seborrheic keratosis: Secondary | ICD-10-CM

## 2021-04-06 DIAGNOSIS — D1801 Hemangioma of skin and subcutaneous tissue: Secondary | ICD-10-CM

## 2021-04-06 DIAGNOSIS — D485 Neoplasm of uncertain behavior of skin: Secondary | ICD-10-CM

## 2021-04-08 ENCOUNTER — Other Ambulatory Visit: Payer: Self-pay

## 2021-04-08 ENCOUNTER — Telehealth (INDEPENDENT_AMBULATORY_CARE_PROVIDER_SITE_OTHER): Payer: BC Managed Care – PPO | Admitting: Nurse Practitioner

## 2021-04-08 ENCOUNTER — Encounter: Payer: Self-pay | Admitting: Nurse Practitioner

## 2021-04-08 DIAGNOSIS — G4731 Primary central sleep apnea: Secondary | ICD-10-CM | POA: Diagnosis not present

## 2021-04-08 NOTE — Patient Instructions (Signed)
Continue to use [name] device every night, minimum of 6 hours a night.  Change equipment every 30 days or as directed by DME. Wash your tubing with warm soap and water daily, hang to dry. Wash humidifier portion weekly.  Maintain clean equipment, as directed by home health agency.  Be aware of reduced alertness and do not drive or operate heavy machinery if experiencing this or drowsiness.  Exercise encouraged, as tolerated. Avoid or decrease alcohol consumption and medications that make you more sleepy, if possible. Notify if persistent daytime sleepiness occurs even with consistent use of device.   Mask desensitization fitting 04/21/2021  Follow up in 6 months with Dr. Halford Chessman or APP. If symptoms do not improve or worsen, please contact office for sooner follow up or seek emergency care.

## 2021-04-08 NOTE — Assessment & Plan Note (Signed)
Compliant with therapy. Scheduled for mask fitting 04/21/2021. Hopefully this will help with comfort. Continue with current pressure settings EPAP 7.6 cmH2O, Min PS 4, Max PS 16.6 cmH2O. Discussed issues with current machine; seems to be working well but not as user friendly as her previous machine. Understands to follow up with DME if she continues to have issues.   Patient Instructions  Continue to use [name] device every night, minimum of 6 hours a night.  Change equipment every 30 days or as directed by DME. Wash your tubing with warm soap and water daily, hang to dry. Wash humidifier portion weekly.  Maintain clean equipment, as directed by home health agency.  Be aware of reduced alertness and do not drive or operate heavy machinery if experiencing this or drowsiness.  Exercise encouraged, as tolerated. Avoid or decrease alcohol consumption and medications that make you more sleepy, if possible. Notify if persistent daytime sleepiness occurs even with consistent use of device.   Mask desensitization fitting 04/21/2021  Follow up in 6 months with Dr. Halford Chessman or APP. If symptoms do not improve or worsen, please contact office for sooner follow up or seek emergency care.

## 2021-04-08 NOTE — Progress Notes (Signed)
Patient ID: Regina Long, female     DOB: 15-Aug-1975, 45 y.o.      MRN: [id number]  Chief Complaint  Patient presents with   Follow-up    No c/o     Virtual Visit via Video Note  I connected with Regina Long on 04/08/21 at  3:00 PM EST by a video enabled telemedicine application and verified that I am speaking with the correct person using two identifiers.  Location: Patient: Home Provider: Office   I discussed the limitations of evaluation and management by telemedicine and the availability of in person appointments. The patient expressed understanding and agreed to proceed.  History of Present Illness: 45 year old female, former smoker (<0.5 ppd for 3 years) followed for complex sleep apnea syndrome. She is a patient of Dr. Juanetta Gosling and was last seen in office on 01/07/2021. Past medical history significant chronic headaches, fibromyalgia, chronic pain, DJD, IBS, memory difficulties.   01/07/2021: OV with Dr. Halford Chessman.  Patient reported having difficulties with mask.  She was scheduled for mask fitting at [facility]. Ok to use nasal mask if this is more comfortable. Continued on [name] device. Compliant with therapy.   04/08/2021: Today - Follow up Patient presents today via virtual video visit for 90 day follow up of [name] therapy for complex sleep apnea. Download report shows 100% compliance (97% >4hrs) and average use of 8 hours and 38 minutes. She reports some frustrations with the new machine that she has, but continues to use it nightly. She also does not feel that her masks fits properly and is scheduled for mask fitting at [facility] sleep center in January. Her pressure settings are comfortable and she denies any other difficulties with the machine or masks. She denies narcolepsy or drowsy driving.   AirView Download: [name] mode EPAP 7.6 cmH2O Min PS 4 cmH2O, Max 16.6 cmH2O Leaks: median 4.9 L/min, 95th percentile 6.8 L/min AHI 0.3/hr  Allergies  Allergen Reactions    Influenza Vaccines Other (See Comments)    Flare up of fibromyalgia   Ritalin [Methylphenidate Hcl] Other (See Comments)    Pt states that this medication "knocked her out".     Immunization History  Administered Date(s) Administered   Tdap 02/03/2011, 09/10/2020   Past Medical History:  Diagnosis Date   Arthritis    Chronic headache    Complex sleep apnea syndrome    DJD (degenerative joint disease)    Fibromyalgia    IBS (irritable bowel syndrome)    Short-term memory loss     Tobacco History: Social History   Tobacco Use  Smoking Status Former   Years: 3.00   Types: Cigarettes   Quit date: 04/19/2004   Years since quitting: 16.9  Smokeless Tobacco Never  Tobacco Comments   <1/2 ppd   Counseling given: Not Answered Tobacco comments: <1/2 ppd   Outpatient Medications Prior to Visit  Medication Sig Dispense Refill   DULoxetine (CYMBALTA) 30 MG capsule duloxetine 30 mg capsule,delayed release     estazolam (PROSOM) 2 MG tablet Take 2 mg by mouth at bedtime.     gabapentin (NEURONTIN) 300 MG capsule Take 300 mg by mouth 2 (two) times daily.      levonorgestrel (MIRENA, 52 MG,) 20 MCG/DAY IUD Mirena 20 mcg/24 hours (8 yrs) 52 mg intrauterine device  provided by [facility]     morphine (MSIR) 15 MG tablet Take by mouth.     temazepam (RESTORIL) 7.5 MG capsule Take 7.5 mg  by mouth at bedtime as needed.     UNABLE TO FIND Med Name: [employer] Life Long Vitality Pack (xEOmega, AlphaCRS, Micro Plex VMz)     No facility-administered medications prior to visit.     Review of Systems:   Constitutional: No weight loss or gain, night sweats, fevers, chills, fatigue, or lassitude. HEENT: No headaches, difficulty swallowing, tooth/dental problems, or sore throat. No sneezing, itching, ear ache, nasal congestion, or post nasal drip CV:  No chest pain, orthopnea, PND, swelling in lower extremities, anasarca, dizziness, palpitations, syncope Resp: No shortness of breath with  exertion or at rest. No excess mucus or change in color of mucus. No productive or non-productive. No hemoptysis. No wheezing.  No chest wall deformity GI:  No heartburn, indigestion, abdominal pain, nausea, vomiting, diarrhea, change in bowel habits, loss of appetite, bloody stools.  GU: No dysuria, change in color of urine, urgency or frequency.  No flank pain, no hematuria  Skin: No rash, lesions, ulcerations MSK:  No joint pain or swelling.  No decreased range of motion.  No back pain. Neuro: No dizziness or lightheadedness.  Psych: No depression or anxiety. Mood stable.   Observations/Objective: Interactive, pleasant. Well-appearing and without any shortness of breath or cough during visit. A&Ox3.   12/05/2011 PSG: AHI 16.4, SPO2 low 86% >> central apnea index 11.1, obstructive apnea index 1.2, mixed apnea index 0.2, hypopnea index 3.9.  Consistent with central sleep apnea 05/01/2012 ONO with CPAP and RA: Test time 6 hours 19 minutes.  Mean SPO2 97%, low SPO2 79%.  Spent 12 seconds with SPO2 less than 88%.  Supplemental oxygen not indicated. 05/02/2012: CPAP mask desensitization  Assessment and Plan: Complex sleep apnea syndrome Compliant with therapy. Scheduled for mask fitting 04/21/2021. Hopefully this will help with comfort. Continue with current pressure settings EPAP 7.6 cmH2O, Min PS 4, Max PS 16.6 cmH2O. Discussed issues with current machine; seems to be working well but not as user friendly as her previous machine. Understands to follow up with DME if she continues to have issues.   Patient Instructions  Continue to use [name] device every night, minimum of 6 hours a night.  Change equipment every 30 days or as directed by DME. Wash your tubing with warm soap and water daily, hang to dry. Wash humidifier portion weekly.  Maintain clean equipment, as directed by home health agency.  Be aware of reduced alertness and do not drive or operate heavy machinery if experiencing this or  drowsiness.  Exercise encouraged, as tolerated. Avoid or decrease alcohol consumption and medications that make you more sleepy, if possible. Notify if persistent daytime sleepiness occurs even with consistent use of device.   Mask desensitization fitting 04/21/2021  Follow up in 6 months with Dr. Halford Chessman or APP. If symptoms do not improve or worsen, please contact office for sooner follow up or seek emergency care.    Follow Up Instructions: Follow up 6 months with Dr. Halford Chessman or APP. If symptoms do not improve or worsen, please contact office for sooner follow up or seek emergency care.    I discussed the assessment and treatment plan with the patient. The patient was provided an opportunity to ask questions and all were answered. The patient agreed with the plan and demonstrated an understanding of the instructions.   The patient was advised to call back or seek an in-person evaluation if the symptoms worsen or if the condition fails to improve as anticipated.  I provided 20 minutes of non-face-to-face time  during this encounter.   Clayton Bibles, NP

## 2021-04-08 NOTE — Progress Notes (Signed)
Reviewed and agree with assessment/plan.   Chesley Mires, MD [facility] 04/08/2021, 3:46 PM Pager:  [phone]

## 2021-04-21 ENCOUNTER — Ambulatory Visit (HOSPITAL_BASED_OUTPATIENT_CLINIC_OR_DEPARTMENT_OTHER): Payer: BC Managed Care – PPO | Attending: Pulmonary Disease | Admitting: Pulmonary Disease

## 2021-04-21 DIAGNOSIS — G4731 Primary central sleep apnea: Secondary | ICD-10-CM

## 2021-04-21 DIAGNOSIS — Z789 Other specified health status: Secondary | ICD-10-CM

## 2021-04-22 DIAGNOSIS — M9901 Segmental and somatic dysfunction of cervical region: Secondary | ICD-10-CM | POA: Diagnosis not present

## 2021-04-22 DIAGNOSIS — M9903 Segmental and somatic dysfunction of lumbar region: Secondary | ICD-10-CM | POA: Diagnosis not present

## 2021-04-22 DIAGNOSIS — M5011 Cervical disc disorder with radiculopathy,  high cervical region: Secondary | ICD-10-CM | POA: Diagnosis not present

## 2021-04-22 NOTE — Procedures (Signed)
° ° °  She was provided [employer] Gel with medium pillows, and Dreamwear Pillow mask.  She was also provided a chin strap.  [place], Tax adviser of Sleep Medicine  ELECTRONICALLY SIGNED ON:  04/22/2021, 4:11 PM [place] PH: [phone]   FX: [phone] [place]

## 2021-04-23 ENCOUNTER — Encounter: Payer: Self-pay | Admitting: Pulmonary Disease

## 2021-04-23 NOTE — Telephone Encounter (Signed)
Will forward to [facility] as FYI.

## 2021-04-30 ENCOUNTER — Encounter: Payer: Self-pay | Admitting: Dermatology

## 2021-04-30 NOTE — Progress Notes (Signed)
° °  New Patient   Subjective  Regina Long is a 46 y.o. female who presents for the following: New Patient (Initial Visit) (Patient here today for lesion on her scalp x years that's growing and irritated and some bleeding with brushing hair. Patient also has a lesion on her left upper chest x years no bleeding, per patient it's growing. Check lesion on her back x years no bleeding per patient she does pick at the lesion. No personal history or family history of atypical moles, melanoma or non mole skin cancer. ).  General skin examination, several areas of concern particularly scalp and breast Location:  Duration:  Quality:  Associated Signs/Symptoms: Modifying Factors:  Severity:  Timing: Context:    The following portions of the chart were reviewed this encounter and updated as appropriate:  Tobacco   Allergies   Meds   Problems   Med Hx   Surg Hx   Fam Hx       Objective  Well appearing patient in no apparent distress; mood and affect are within normal limits. Upper Body General skin examination: No atypical pigmented lesions.  1 lesion with history of change on scalp will be biopsied.  Pt is on an anti inflammatory diet due to Fibrmyalgia   Multiple 1 mm smooth red dermal papules   Right Breast, Torso - Posterior (Back) Noninflamed 8 mm flattopped light brown papule, typical dermoscopy   posterior Scalp Tan slightly inflamed 9 mm crust     Left Breast Inflamed pink keratotic papule    A full examination was performed including scalp, head, eyes, ears, nose, lips, neck, chest, axillae, abdomen, back, buttocks, bilateral upper extremities, bilateral lower extremities, hands, feet, fingers, toes, fingernails, and toenails. All findings within normal limits unless otherwise noted below.  Areas beneath undergarments not fully examined.   Assessment & Plan  Screening exam for skin cancer Upper Body  Annual skin examination.  Seborrheic keratosis (2) Right Breast;  Torso - Posterior (Back)  Benign, ok to leave unless pt wants removed or clinically changes  Cherry angioma  No intervention necessary  Neoplasm of uncertain behavior of skin posterior Scalp  Skin / nail biopsy Type of biopsy: tangential   Informed consent: discussed and consent obtained   Timeout: patient name, date of birth, surgical site, and procedure verified   Anesthesia: the lesion was anesthetized in a standard fashion   Anesthetic:  1% lidocaine w/ epinephrine 1-100,000 local infiltration Instrument used: flexible razor blade   Hemostasis achieved with: ferric subsulfate   Outcome: patient tolerated procedure well   Post-procedure details: wound care instructions given    Specimen 1 - Surgical pathology Differential Diagnosis: cn  Check Margins: No  Inflamed seborrheic keratosis Left Breast  Destruction of lesion - Left Breast Complexity: simple   Destruction method: cryotherapy   Informed consent: discussed and consent obtained   Lesion destroyed using liquid nitrogen: Yes   Cryotherapy cycles:  3 Outcome: patient tolerated procedure well with no complications   Post-procedure details: wound care instructions given

## 2021-05-02 DIAGNOSIS — G4731 Primary central sleep apnea: Secondary | ICD-10-CM | POA: Diagnosis not present

## 2021-05-06 DIAGNOSIS — M5011 Cervical disc disorder with radiculopathy,  high cervical region: Secondary | ICD-10-CM | POA: Diagnosis not present

## 2021-05-06 DIAGNOSIS — M9901 Segmental and somatic dysfunction of cervical region: Secondary | ICD-10-CM | POA: Diagnosis not present

## 2021-05-06 DIAGNOSIS — M9903 Segmental and somatic dysfunction of lumbar region: Secondary | ICD-10-CM | POA: Diagnosis not present

## 2021-05-13 DIAGNOSIS — G4733 Obstructive sleep apnea (adult) (pediatric): Secondary | ICD-10-CM | POA: Diagnosis not present

## 2021-05-18 DIAGNOSIS — M5011 Cervical disc disorder with radiculopathy,  high cervical region: Secondary | ICD-10-CM | POA: Diagnosis not present

## 2021-05-18 DIAGNOSIS — M9901 Segmental and somatic dysfunction of cervical region: Secondary | ICD-10-CM | POA: Diagnosis not present

## 2021-05-18 DIAGNOSIS — M9903 Segmental and somatic dysfunction of lumbar region: Secondary | ICD-10-CM | POA: Diagnosis not present

## 2021-05-25 DIAGNOSIS — M9903 Segmental and somatic dysfunction of lumbar region: Secondary | ICD-10-CM | POA: Diagnosis not present

## 2021-05-25 DIAGNOSIS — M9901 Segmental and somatic dysfunction of cervical region: Secondary | ICD-10-CM | POA: Diagnosis not present

## 2021-05-25 DIAGNOSIS — M5011 Cervical disc disorder with radiculopathy,  high cervical region: Secondary | ICD-10-CM | POA: Diagnosis not present

## 2021-06-02 DIAGNOSIS — G4731 Primary central sleep apnea: Secondary | ICD-10-CM | POA: Diagnosis not present

## 2021-06-15 DIAGNOSIS — M5011 Cervical disc disorder with radiculopathy,  high cervical region: Secondary | ICD-10-CM | POA: Diagnosis not present

## 2021-06-15 DIAGNOSIS — M9903 Segmental and somatic dysfunction of lumbar region: Secondary | ICD-10-CM | POA: Diagnosis not present

## 2021-06-15 DIAGNOSIS — M9901 Segmental and somatic dysfunction of cervical region: Secondary | ICD-10-CM | POA: Diagnosis not present

## 2021-06-24 DIAGNOSIS — M9901 Segmental and somatic dysfunction of cervical region: Secondary | ICD-10-CM | POA: Diagnosis not present

## 2021-06-24 DIAGNOSIS — M9903 Segmental and somatic dysfunction of lumbar region: Secondary | ICD-10-CM | POA: Diagnosis not present

## 2021-06-24 DIAGNOSIS — M5011 Cervical disc disorder with radiculopathy,  high cervical region: Secondary | ICD-10-CM | POA: Diagnosis not present

## 2021-06-30 DIAGNOSIS — G4731 Primary central sleep apnea: Secondary | ICD-10-CM | POA: Diagnosis not present

## 2021-07-01 DIAGNOSIS — M9903 Segmental and somatic dysfunction of lumbar region: Secondary | ICD-10-CM | POA: Diagnosis not present

## 2021-07-01 DIAGNOSIS — M9901 Segmental and somatic dysfunction of cervical region: Secondary | ICD-10-CM | POA: Diagnosis not present

## 2021-07-01 DIAGNOSIS — M5011 Cervical disc disorder with radiculopathy,  high cervical region: Secondary | ICD-10-CM | POA: Diagnosis not present

## 2021-07-08 DIAGNOSIS — M9903 Segmental and somatic dysfunction of lumbar region: Secondary | ICD-10-CM | POA: Diagnosis not present

## 2021-07-08 DIAGNOSIS — M5011 Cervical disc disorder with radiculopathy,  high cervical region: Secondary | ICD-10-CM | POA: Diagnosis not present

## 2021-07-08 DIAGNOSIS — M9901 Segmental and somatic dysfunction of cervical region: Secondary | ICD-10-CM | POA: Diagnosis not present

## 2021-07-31 DIAGNOSIS — G4731 Primary central sleep apnea: Secondary | ICD-10-CM | POA: Diagnosis not present

## 2021-08-27 DIAGNOSIS — M5011 Cervical disc disorder with radiculopathy,  high cervical region: Secondary | ICD-10-CM | POA: Diagnosis not present

## 2021-08-27 DIAGNOSIS — M9901 Segmental and somatic dysfunction of cervical region: Secondary | ICD-10-CM | POA: Diagnosis not present

## 2021-08-27 DIAGNOSIS — M9903 Segmental and somatic dysfunction of lumbar region: Secondary | ICD-10-CM | POA: Diagnosis not present

## 2021-08-30 DIAGNOSIS — G4731 Primary central sleep apnea: Secondary | ICD-10-CM | POA: Diagnosis not present

## 2021-09-16 DIAGNOSIS — G4733 Obstructive sleep apnea (adult) (pediatric): Secondary | ICD-10-CM | POA: Diagnosis not present

## 2021-09-17 DIAGNOSIS — M9901 Segmental and somatic dysfunction of cervical region: Secondary | ICD-10-CM | POA: Diagnosis not present

## 2021-09-17 DIAGNOSIS — M5011 Cervical disc disorder with radiculopathy,  high cervical region: Secondary | ICD-10-CM | POA: Diagnosis not present

## 2021-09-17 DIAGNOSIS — M9903 Segmental and somatic dysfunction of lumbar region: Secondary | ICD-10-CM | POA: Diagnosis not present

## 2021-09-24 DIAGNOSIS — M9901 Segmental and somatic dysfunction of cervical region: Secondary | ICD-10-CM | POA: Diagnosis not present

## 2021-09-24 DIAGNOSIS — M5011 Cervical disc disorder with radiculopathy,  high cervical region: Secondary | ICD-10-CM | POA: Diagnosis not present

## 2021-09-24 DIAGNOSIS — M9903 Segmental and somatic dysfunction of lumbar region: Secondary | ICD-10-CM | POA: Diagnosis not present

## 2021-09-30 DIAGNOSIS — G4731 Primary central sleep apnea: Secondary | ICD-10-CM | POA: Diagnosis not present

## 2021-10-01 DIAGNOSIS — M9901 Segmental and somatic dysfunction of cervical region: Secondary | ICD-10-CM | POA: Diagnosis not present

## 2021-10-01 DIAGNOSIS — M5011 Cervical disc disorder with radiculopathy,  high cervical region: Secondary | ICD-10-CM | POA: Diagnosis not present

## 2021-10-01 DIAGNOSIS — M9903 Segmental and somatic dysfunction of lumbar region: Secondary | ICD-10-CM | POA: Diagnosis not present

## 2021-10-13 DIAGNOSIS — M9903 Segmental and somatic dysfunction of lumbar region: Secondary | ICD-10-CM | POA: Diagnosis not present

## 2021-10-13 DIAGNOSIS — M9901 Segmental and somatic dysfunction of cervical region: Secondary | ICD-10-CM | POA: Diagnosis not present

## 2021-10-13 DIAGNOSIS — M5011 Cervical disc disorder with radiculopathy,  high cervical region: Secondary | ICD-10-CM | POA: Diagnosis not present

## 2021-10-15 DIAGNOSIS — Z01419 Encounter for gynecological examination (general) (routine) without abnormal findings: Secondary | ICD-10-CM | POA: Diagnosis not present

## 2021-10-15 DIAGNOSIS — Z681 Body mass index (BMI) 19 or less, adult: Secondary | ICD-10-CM | POA: Diagnosis not present

## 2021-10-15 DIAGNOSIS — R339 Retention of urine, unspecified: Secondary | ICD-10-CM | POA: Diagnosis not present

## 2021-10-16 DIAGNOSIS — G4733 Obstructive sleep apnea (adult) (pediatric): Secondary | ICD-10-CM | POA: Diagnosis not present

## 2021-10-29 DIAGNOSIS — Z13220 Encounter for screening for lipoid disorders: Secondary | ICD-10-CM | POA: Diagnosis not present

## 2021-10-29 DIAGNOSIS — D519 Vitamin B12 deficiency anemia, unspecified: Secondary | ICD-10-CM | POA: Diagnosis not present

## 2021-10-29 DIAGNOSIS — E559 Vitamin D deficiency, unspecified: Secondary | ICD-10-CM | POA: Diagnosis not present

## 2021-10-29 DIAGNOSIS — Z0000 Encounter for general adult medical examination without abnormal findings: Secondary | ICD-10-CM | POA: Diagnosis not present

## 2021-10-30 DIAGNOSIS — G4731 Primary central sleep apnea: Secondary | ICD-10-CM | POA: Diagnosis not present

## 2021-11-03 DIAGNOSIS — M5011 Cervical disc disorder with radiculopathy,  high cervical region: Secondary | ICD-10-CM | POA: Diagnosis not present

## 2021-11-03 DIAGNOSIS — M9903 Segmental and somatic dysfunction of lumbar region: Secondary | ICD-10-CM | POA: Diagnosis not present

## 2021-11-03 DIAGNOSIS — M9901 Segmental and somatic dysfunction of cervical region: Secondary | ICD-10-CM | POA: Diagnosis not present

## 2021-11-11 DIAGNOSIS — M5011 Cervical disc disorder with radiculopathy,  high cervical region: Secondary | ICD-10-CM | POA: Diagnosis not present

## 2021-11-11 DIAGNOSIS — M9901 Segmental and somatic dysfunction of cervical region: Secondary | ICD-10-CM | POA: Diagnosis not present

## 2021-11-11 DIAGNOSIS — M9903 Segmental and somatic dysfunction of lumbar region: Secondary | ICD-10-CM | POA: Diagnosis not present

## 2021-11-13 DIAGNOSIS — G4733 Obstructive sleep apnea (adult) (pediatric): Secondary | ICD-10-CM | POA: Diagnosis not present

## 2021-11-16 DIAGNOSIS — M9901 Segmental and somatic dysfunction of cervical region: Secondary | ICD-10-CM | POA: Diagnosis not present

## 2021-11-16 DIAGNOSIS — M9903 Segmental and somatic dysfunction of lumbar region: Secondary | ICD-10-CM | POA: Diagnosis not present

## 2021-11-16 DIAGNOSIS — M5011 Cervical disc disorder with radiculopathy,  high cervical region: Secondary | ICD-10-CM | POA: Diagnosis not present

## 2021-11-24 DIAGNOSIS — M9901 Segmental and somatic dysfunction of cervical region: Secondary | ICD-10-CM | POA: Diagnosis not present

## 2021-11-24 DIAGNOSIS — M5011 Cervical disc disorder with radiculopathy,  high cervical region: Secondary | ICD-10-CM | POA: Diagnosis not present

## 2021-11-24 DIAGNOSIS — M9903 Segmental and somatic dysfunction of lumbar region: Secondary | ICD-10-CM | POA: Diagnosis not present

## 2021-11-30 DIAGNOSIS — G4731 Primary central sleep apnea: Secondary | ICD-10-CM | POA: Diagnosis not present

## 2021-12-01 DIAGNOSIS — M9901 Segmental and somatic dysfunction of cervical region: Secondary | ICD-10-CM | POA: Diagnosis not present

## 2021-12-01 DIAGNOSIS — M5011 Cervical disc disorder with radiculopathy,  high cervical region: Secondary | ICD-10-CM | POA: Diagnosis not present

## 2021-12-01 DIAGNOSIS — M9903 Segmental and somatic dysfunction of lumbar region: Secondary | ICD-10-CM | POA: Diagnosis not present

## 2021-12-07 DIAGNOSIS — M5011 Cervical disc disorder with radiculopathy,  high cervical region: Secondary | ICD-10-CM | POA: Diagnosis not present

## 2021-12-07 DIAGNOSIS — M9903 Segmental and somatic dysfunction of lumbar region: Secondary | ICD-10-CM | POA: Diagnosis not present

## 2021-12-07 DIAGNOSIS — M9901 Segmental and somatic dysfunction of cervical region: Secondary | ICD-10-CM | POA: Diagnosis not present

## 2021-12-15 DIAGNOSIS — M5011 Cervical disc disorder with radiculopathy,  high cervical region: Secondary | ICD-10-CM | POA: Diagnosis not present

## 2021-12-15 DIAGNOSIS — M9903 Segmental and somatic dysfunction of lumbar region: Secondary | ICD-10-CM | POA: Diagnosis not present

## 2021-12-15 DIAGNOSIS — G4733 Obstructive sleep apnea (adult) (pediatric): Secondary | ICD-10-CM | POA: Diagnosis not present

## 2021-12-15 DIAGNOSIS — M9901 Segmental and somatic dysfunction of cervical region: Secondary | ICD-10-CM | POA: Diagnosis not present

## 2021-12-22 DIAGNOSIS — M9901 Segmental and somatic dysfunction of cervical region: Secondary | ICD-10-CM | POA: Diagnosis not present

## 2021-12-22 DIAGNOSIS — M9903 Segmental and somatic dysfunction of lumbar region: Secondary | ICD-10-CM | POA: Diagnosis not present

## 2021-12-22 DIAGNOSIS — M5011 Cervical disc disorder with radiculopathy,  high cervical region: Secondary | ICD-10-CM | POA: Diagnosis not present

## 2021-12-28 DIAGNOSIS — M9901 Segmental and somatic dysfunction of cervical region: Secondary | ICD-10-CM | POA: Diagnosis not present

## 2021-12-28 DIAGNOSIS — M9903 Segmental and somatic dysfunction of lumbar region: Secondary | ICD-10-CM | POA: Diagnosis not present

## 2021-12-28 DIAGNOSIS — M5011 Cervical disc disorder with radiculopathy,  high cervical region: Secondary | ICD-10-CM | POA: Diagnosis not present

## 2022-01-04 DIAGNOSIS — M5011 Cervical disc disorder with radiculopathy,  high cervical region: Secondary | ICD-10-CM | POA: Diagnosis not present

## 2022-01-04 DIAGNOSIS — M9903 Segmental and somatic dysfunction of lumbar region: Secondary | ICD-10-CM | POA: Diagnosis not present

## 2022-01-04 DIAGNOSIS — M9901 Segmental and somatic dysfunction of cervical region: Secondary | ICD-10-CM | POA: Diagnosis not present

## 2022-01-12 DIAGNOSIS — M5011 Cervical disc disorder with radiculopathy,  high cervical region: Secondary | ICD-10-CM | POA: Diagnosis not present

## 2022-01-12 DIAGNOSIS — M9901 Segmental and somatic dysfunction of cervical region: Secondary | ICD-10-CM | POA: Diagnosis not present

## 2022-01-12 DIAGNOSIS — M9903 Segmental and somatic dysfunction of lumbar region: Secondary | ICD-10-CM | POA: Diagnosis not present

## 2022-01-18 DIAGNOSIS — M9903 Segmental and somatic dysfunction of lumbar region: Secondary | ICD-10-CM | POA: Diagnosis not present

## 2022-01-18 DIAGNOSIS — M5011 Cervical disc disorder with radiculopathy,  high cervical region: Secondary | ICD-10-CM | POA: Diagnosis not present

## 2022-01-18 DIAGNOSIS — M9901 Segmental and somatic dysfunction of cervical region: Secondary | ICD-10-CM | POA: Diagnosis not present

## 2022-01-26 DIAGNOSIS — M5011 Cervical disc disorder with radiculopathy,  high cervical region: Secondary | ICD-10-CM | POA: Diagnosis not present

## 2022-01-26 DIAGNOSIS — M9903 Segmental and somatic dysfunction of lumbar region: Secondary | ICD-10-CM | POA: Diagnosis not present

## 2022-01-26 DIAGNOSIS — M9901 Segmental and somatic dysfunction of cervical region: Secondary | ICD-10-CM | POA: Diagnosis not present

## 2022-02-01 DIAGNOSIS — M9903 Segmental and somatic dysfunction of lumbar region: Secondary | ICD-10-CM | POA: Diagnosis not present

## 2022-02-01 DIAGNOSIS — M9901 Segmental and somatic dysfunction of cervical region: Secondary | ICD-10-CM | POA: Diagnosis not present

## 2022-02-01 DIAGNOSIS — M5011 Cervical disc disorder with radiculopathy,  high cervical region: Secondary | ICD-10-CM | POA: Diagnosis not present

## 2022-02-05 ENCOUNTER — Encounter: Payer: Self-pay | Admitting: Primary Care

## 2022-02-05 ENCOUNTER — Ambulatory Visit (INDEPENDENT_AMBULATORY_CARE_PROVIDER_SITE_OTHER): Payer: BC Managed Care – PPO | Admitting: Primary Care

## 2022-02-05 VITALS — BP 102/64 | HR 64 | Temp 98.3°F | Ht 65.0 in | Wt 116.8 lb

## 2022-02-05 DIAGNOSIS — G4731 Primary central sleep apnea: Secondary | ICD-10-CM | POA: Diagnosis not present

## 2022-02-05 NOTE — Patient Instructions (Signed)
Apnea are controlled on current pressure settings. Since you have tapered off a lot of narcotics would recommend we consider getting an updated sleep study in lab to re-assess central apneas. May be able to come off PAP therapy and use oral appliance for snoring  No changes today, will get in touch with you after I speak with Dr. Halford Chessman. Call or send me a message if you have not heard back by end of next week   Follow-up: 6 months with Dr. Halford Chessman or sooner if needed

## 2022-02-05 NOTE — Progress Notes (Addendum)
$'@Patient'Z$  ID: Ronnald Collum, female    DOB: 1975-05-23, 46 y.o.   MRN: [id number]  Chief Complaint  Patient presents with   Follow-up    Sleep apnea.  Pt having trouble getting a mask to fit her face.  Waking up a night d/t uncomfortable mask.    Referring provider: Janith Lima, MD  HPI: 46 year old female, former smoker (<0.5 ppd for 3 years) followed for complex sleep apnea syndrome. Past medical history significant chronic headaches, fibromyalgia, chronic pain, DJD, IBS, memory difficulties. She is a patient of Dr. Juanetta Gosling.   Previous [facility] pulmonary encounter:  01/07/2021: OV with Dr. Halford Chessman.  Patient reported having difficulties with mask.  She was scheduled for mask fitting at [facility] long sleep center. Ok to use nasal mask if this is more comfortable. Continued on [name] device. Compliant with therapy.   04/08/2021 Patient presents today via virtual video visit for 90 day follow up of [name] therapy for complex sleep apnea. Download report shows 100% compliance (97% >4hrs) and average use of 8 hours and 38 minutes. She reports some frustrations with the new machine that she has, but continues to use it nightly. She also does not feel that her masks fits properly and is scheduled for mask fitting at [facility] sleep center in January. Her pressure settings are comfortable and she denies any other difficulties with the machine or masks. She denies narcolepsy or drowsy driving.   AirView Download: [name] mode EPAP 7.6 cmH2O Min PS 4 cmH2O, Max 16.6 cmH2O Leaks: median 4.9 L/min, 95th percentile 6.8 L/min AHI 0.3/hr  02/05/2022 Patient presents today for follow-up OSA.   She has difficulty using CPAP mask. She has exhausted most of her options. She has tried dozens of different face masks. Her main issue with mask fit is face size and shape. No issues with pressure settings. She has come off hydrocodone and tapered down on both Neurontin and Morphine significantly. She is taking '300mg'$  Neurontin twice  daily and Morphine '15mg'$  TID. She does report dry mouth and snoring symptoms if she falls asleep without PAP.   01/04/22-02/02/22 30/30 DAYS; 100% > 4 HOURS Average usage 8 hours 33 mins [name] mode Epapa 7.6 Min PS 4 Max PS 16.6 Airleaks 0.2L/min  AHI 0.4   Allergies  Allergen Reactions   Influenza Vaccines Other (See Comments)    Flare up of fibromyalgia   Ritalin [Methylphenidate Hcl] Other (See Comments)    Pt states that this medication "knocked her out".      Immunization History  Administered Date(s) Administered   Tdap 02/03/2011, 09/10/2020    Past Medical History:  Diagnosis Date   Arthritis    Chronic headache    Complex sleep apnea syndrome    DJD (degenerative joint disease)    Fibromyalgia    IBS (irritable bowel syndrome)    Short-term memory loss     Tobacco History: Social History   Tobacco Use  Smoking Status Former   Years: 3.00   Types: Cigarettes   Quit date: 04/19/2004   Years since quitting: 17.8  Smokeless Tobacco Never  Tobacco Comments   <1/2 ppd   Counseling given: Not Answered Tobacco comments: <1/2 ppd   Outpatient Medications Prior to Visit  Medication Sig Dispense Refill   DULoxetine (CYMBALTA) 30 MG capsule duloxetine 30 mg capsule,delayed release     estazolam (PROSOM) 2 MG tablet Take 2 mg by mouth at bedtime.     gabapentin (NEURONTIN) 300 MG capsule Take 300  mg by mouth 2 (two) times daily.      levonorgestrel (MIRENA, 52 MG,) 20 MCG/DAY IUD Mirena 20 mcg/24 hours (8 yrs) 52 mg intrauterine device  provided by [facility]     morphine (MSIR) 15 MG tablet Take by mouth.     UNABLE TO FIND Med Name: [employer] Life Long Vitality Pack (xEOmega, AlphaCRS, Micro Plex VMz)     temazepam (RESTORIL) 7.5 MG capsule Take 7.5 mg by mouth at bedtime as needed. (Patient not taking: Reported on 02/05/2022)     No facility-administered medications prior to visit.   Review of Systems  Review of Systems  Constitutional: Negative.    Respiratory: Negative.       Physical Exam  BP 102/64 (BP Location: Left Arm, Patient Position: Sitting, Cuff Size: Normal)   Pulse 64   Temp 98.3 F (36.8 C) (Oral)   Ht '5\' 5"'$  (1.651 m)   Wt 116 lb 12.8 oz (53 kg)   SpO2 100%   BMI 19.44 kg/m  Physical Exam Constitutional:      Appearance: Normal appearance.  Cardiovascular:     Rate and Rhythm: Normal rate and regular rhythm.  Pulmonary:     Effort: Pulmonary effort is normal.     Breath sounds: Normal breath sounds.  Skin:    General: Skin is warm and dry.  Neurological:     General: No focal deficit present.     Mental Status: She is alert and oriented to person, place, and time. Mental status is at baseline.  Psychiatric:        Mood and Affect: Mood normal.        Behavior: Behavior normal.        Thought Content: Thought content normal.        Judgment: Judgment normal.      Lab Results:  CBC    Component Value Date/Time   WBC 7.8 09/10/2020 1608   RBC 4.00 09/10/2020 1608   HGB 12.7 09/10/2020 1608   HCT 37.8 09/10/2020 1608   PLT 183.0 09/10/2020 1608   MCV 94.4 09/10/2020 1608   MCHC 33.6 09/10/2020 1608   RDW 12.5 09/10/2020 1608   LYMPHSABS 2.1 09/10/2020 1608   MONOABS 0.6 09/10/2020 1608   EOSABS 0.2 09/10/2020 1608   BASOSABS 0.1 09/10/2020 1608    BMET    Component Value Date/Time   NA 140 09/10/2020 1608   K 4.1 09/10/2020 1608   CL 103 09/10/2020 1608   CO2 32 09/10/2020 1608   GLUCOSE 87 09/10/2020 1608   BUN 9 09/10/2020 1608   BUN 9 02/28/2020 0000   CREATININE 0.62 09/10/2020 1608   CALCIUM 9.4 09/10/2020 1608    BNP No results found for: "BNP"  ProBNP No results found for: "PROBNP"  Imaging: No results found.   Assessment & Plan:   Complex sleep apnea syndrome - Apnea are controlled on current pressure settings. Since she has tapered off a lot of narcotics would recommend we consider getting an updated sleep study in lab to re-assess central apneas. May be able  to come off PAP therapy and use oral appliance for snoring. No changes today. FU in 6 months or sooner if needed.      Martyn Ehrich, NP 02/22/2022

## 2022-02-08 ENCOUNTER — Telehealth: Payer: Self-pay | Admitting: Primary Care

## 2022-02-08 DIAGNOSIS — G4731 Primary central sleep apnea: Secondary | ICD-10-CM

## 2022-02-08 NOTE — Telephone Encounter (Signed)
-----   Message from Chesley Mires, MD sent at 02/05/2022  4:43 PM EDT ----- Regarding: RE: Complex sleep apnea patient on [name]- dif with mask I know her well.  Is she still using essential oils?  Her main issue was central related to chronic opiate use.  She had tried a pediatric mask before, but then I think there was issues with insurance coverage and availability.  I think it's a good idea to repeat a diagnostic sleep study now to reassess her status - should be an in lab sleep study.  [place]  ----- Message ----- From: Martyn Ehrich, NP Sent: 02/05/2022   4:42 PM EDT To: Chesley Mires, MD Subject: Complex sleep apnea patient on [name]- dif with#  Complex sleep patient of yours. She is on [name] mode PAP. Has such difficulty with mask fit d.t face shape. She is compliant with use but it really bothers her. She has tried several several masks. She has not have repeat sleep study since 2013. Mainly had central apneas. She has gotten off a lot of medications. Do you think its reasonable to repeat sleep study to re-evaluation if she is open to it??  She does have dry mouth and snoring if she does not wear PAP mask. She could potential use an oral appliance though if centrals were improved MAYBE  Throughts ???  -[employer]

## 2022-02-08 NOTE — Telephone Encounter (Signed)
Regina Long can you please let patient know that Dr. Halford Chessman did think it was a good idea to repeat sleep study to reassess her central sleep apnea.  Can you please order in lab split-night sleep study re: complex sleep apnea.

## 2022-02-08 NOTE — Telephone Encounter (Signed)
Called and spoke with pt letting her know the info from BW and she verbalized understanding. Order placed for the split night study. Nothing further needed.

## 2022-02-09 DIAGNOSIS — M9901 Segmental and somatic dysfunction of cervical region: Secondary | ICD-10-CM | POA: Diagnosis not present

## 2022-02-09 DIAGNOSIS — M9903 Segmental and somatic dysfunction of lumbar region: Secondary | ICD-10-CM | POA: Diagnosis not present

## 2022-02-09 DIAGNOSIS — M5011 Cervical disc disorder with radiculopathy,  high cervical region: Secondary | ICD-10-CM | POA: Diagnosis not present

## 2022-02-12 DIAGNOSIS — G4733 Obstructive sleep apnea (adult) (pediatric): Secondary | ICD-10-CM | POA: Diagnosis not present

## 2022-02-16 DIAGNOSIS — M9901 Segmental and somatic dysfunction of cervical region: Secondary | ICD-10-CM | POA: Diagnosis not present

## 2022-02-16 DIAGNOSIS — M9903 Segmental and somatic dysfunction of lumbar region: Secondary | ICD-10-CM | POA: Diagnosis not present

## 2022-02-16 DIAGNOSIS — M5011 Cervical disc disorder with radiculopathy,  high cervical region: Secondary | ICD-10-CM | POA: Diagnosis not present

## 2022-02-17 DIAGNOSIS — M9901 Segmental and somatic dysfunction of cervical region: Secondary | ICD-10-CM | POA: Diagnosis not present

## 2022-02-17 DIAGNOSIS — M9903 Segmental and somatic dysfunction of lumbar region: Secondary | ICD-10-CM | POA: Diagnosis not present

## 2022-02-17 DIAGNOSIS — M5011 Cervical disc disorder with radiculopathy,  high cervical region: Secondary | ICD-10-CM | POA: Diagnosis not present

## 2022-02-18 DIAGNOSIS — M5011 Cervical disc disorder with radiculopathy,  high cervical region: Secondary | ICD-10-CM | POA: Diagnosis not present

## 2022-02-18 DIAGNOSIS — M9903 Segmental and somatic dysfunction of lumbar region: Secondary | ICD-10-CM | POA: Diagnosis not present

## 2022-02-18 DIAGNOSIS — M9901 Segmental and somatic dysfunction of cervical region: Secondary | ICD-10-CM | POA: Diagnosis not present

## 2022-02-22 DIAGNOSIS — M9901 Segmental and somatic dysfunction of cervical region: Secondary | ICD-10-CM | POA: Diagnosis not present

## 2022-02-22 DIAGNOSIS — M5011 Cervical disc disorder with radiculopathy,  high cervical region: Secondary | ICD-10-CM | POA: Diagnosis not present

## 2022-02-22 DIAGNOSIS — M9903 Segmental and somatic dysfunction of lumbar region: Secondary | ICD-10-CM | POA: Diagnosis not present

## 2022-02-22 NOTE — Assessment & Plan Note (Addendum)
-   Apnea are controlled on current pressure settings. Since she has tapered off a lot of narcotics would recommend we consider getting an updated sleep study in lab to re-assess central apneas. May be able to come off PAP therapy and use oral appliance for snoring. No changes today. FU in 6 months or sooner if needed.

## 2022-02-25 DIAGNOSIS — M9903 Segmental and somatic dysfunction of lumbar region: Secondary | ICD-10-CM | POA: Diagnosis not present

## 2022-02-25 DIAGNOSIS — M9901 Segmental and somatic dysfunction of cervical region: Secondary | ICD-10-CM | POA: Diagnosis not present

## 2022-02-25 DIAGNOSIS — M5011 Cervical disc disorder with radiculopathy,  high cervical region: Secondary | ICD-10-CM | POA: Diagnosis not present

## 2022-03-01 ENCOUNTER — Other Ambulatory Visit: Payer: Self-pay | Admitting: Obstetrics and Gynecology

## 2022-03-01 DIAGNOSIS — M9901 Segmental and somatic dysfunction of cervical region: Secondary | ICD-10-CM | POA: Diagnosis not present

## 2022-03-01 DIAGNOSIS — M5011 Cervical disc disorder with radiculopathy,  high cervical region: Secondary | ICD-10-CM | POA: Diagnosis not present

## 2022-03-01 DIAGNOSIS — M9903 Segmental and somatic dysfunction of lumbar region: Secondary | ICD-10-CM | POA: Diagnosis not present

## 2022-03-01 DIAGNOSIS — R928 Other abnormal and inconclusive findings on diagnostic imaging of breast: Secondary | ICD-10-CM

## 2022-03-04 DIAGNOSIS — M9901 Segmental and somatic dysfunction of cervical region: Secondary | ICD-10-CM | POA: Diagnosis not present

## 2022-03-04 DIAGNOSIS — M9903 Segmental and somatic dysfunction of lumbar region: Secondary | ICD-10-CM | POA: Diagnosis not present

## 2022-03-04 DIAGNOSIS — M5011 Cervical disc disorder with radiculopathy,  high cervical region: Secondary | ICD-10-CM | POA: Diagnosis not present

## 2022-03-08 DIAGNOSIS — M9901 Segmental and somatic dysfunction of cervical region: Secondary | ICD-10-CM | POA: Diagnosis not present

## 2022-03-08 DIAGNOSIS — M9903 Segmental and somatic dysfunction of lumbar region: Secondary | ICD-10-CM | POA: Diagnosis not present

## 2022-03-08 DIAGNOSIS — M5011 Cervical disc disorder with radiculopathy,  high cervical region: Secondary | ICD-10-CM | POA: Diagnosis not present

## 2022-03-10 DIAGNOSIS — M5011 Cervical disc disorder with radiculopathy,  high cervical region: Secondary | ICD-10-CM | POA: Diagnosis not present

## 2022-03-10 DIAGNOSIS — M9903 Segmental and somatic dysfunction of lumbar region: Secondary | ICD-10-CM | POA: Diagnosis not present

## 2022-03-10 DIAGNOSIS — M9901 Segmental and somatic dysfunction of cervical region: Secondary | ICD-10-CM | POA: Diagnosis not present

## 2022-03-15 DIAGNOSIS — M9903 Segmental and somatic dysfunction of lumbar region: Secondary | ICD-10-CM | POA: Diagnosis not present

## 2022-03-15 DIAGNOSIS — M9901 Segmental and somatic dysfunction of cervical region: Secondary | ICD-10-CM | POA: Diagnosis not present

## 2022-03-15 DIAGNOSIS — M5011 Cervical disc disorder with radiculopathy,  high cervical region: Secondary | ICD-10-CM | POA: Diagnosis not present

## 2022-03-16 DIAGNOSIS — G4733 Obstructive sleep apnea (adult) (pediatric): Secondary | ICD-10-CM | POA: Diagnosis not present

## 2022-03-18 DIAGNOSIS — M9903 Segmental and somatic dysfunction of lumbar region: Secondary | ICD-10-CM | POA: Diagnosis not present

## 2022-03-18 DIAGNOSIS — M5011 Cervical disc disorder with radiculopathy,  high cervical region: Secondary | ICD-10-CM | POA: Diagnosis not present

## 2022-03-18 DIAGNOSIS — M9901 Segmental and somatic dysfunction of cervical region: Secondary | ICD-10-CM | POA: Diagnosis not present

## 2022-03-22 DIAGNOSIS — M9903 Segmental and somatic dysfunction of lumbar region: Secondary | ICD-10-CM | POA: Diagnosis not present

## 2022-03-22 DIAGNOSIS — M9901 Segmental and somatic dysfunction of cervical region: Secondary | ICD-10-CM | POA: Diagnosis not present

## 2022-03-22 DIAGNOSIS — M5011 Cervical disc disorder with radiculopathy,  high cervical region: Secondary | ICD-10-CM | POA: Diagnosis not present

## 2022-03-23 ENCOUNTER — Ambulatory Visit (HOSPITAL_BASED_OUTPATIENT_CLINIC_OR_DEPARTMENT_OTHER): Payer: BC Managed Care – PPO | Attending: Primary Care | Admitting: Pulmonary Disease

## 2022-03-23 DIAGNOSIS — R0683 Snoring: Secondary | ICD-10-CM | POA: Diagnosis not present

## 2022-03-23 DIAGNOSIS — G4761 Periodic limb movement disorder: Secondary | ICD-10-CM | POA: Diagnosis not present

## 2022-03-23 DIAGNOSIS — I493 Ventricular premature depolarization: Secondary | ICD-10-CM | POA: Insufficient documentation

## 2022-03-23 DIAGNOSIS — G4731 Primary central sleep apnea: Secondary | ICD-10-CM

## 2022-03-25 DIAGNOSIS — M5011 Cervical disc disorder with radiculopathy,  high cervical region: Secondary | ICD-10-CM | POA: Diagnosis not present

## 2022-03-25 DIAGNOSIS — M9903 Segmental and somatic dysfunction of lumbar region: Secondary | ICD-10-CM | POA: Diagnosis not present

## 2022-03-25 DIAGNOSIS — M9901 Segmental and somatic dysfunction of cervical region: Secondary | ICD-10-CM | POA: Diagnosis not present

## 2022-03-29 ENCOUNTER — Telehealth: Payer: Self-pay | Admitting: Pulmonary Disease

## 2022-03-29 DIAGNOSIS — M9901 Segmental and somatic dysfunction of cervical region: Secondary | ICD-10-CM | POA: Diagnosis not present

## 2022-03-29 DIAGNOSIS — G4731 Primary central sleep apnea: Secondary | ICD-10-CM

## 2022-03-29 DIAGNOSIS — M5011 Cervical disc disorder with radiculopathy,  high cervical region: Secondary | ICD-10-CM | POA: Diagnosis not present

## 2022-03-29 DIAGNOSIS — M9903 Segmental and somatic dysfunction of lumbar region: Secondary | ICD-10-CM | POA: Diagnosis not present

## 2022-03-29 NOTE — Telephone Encounter (Signed)
Sleep study 03/23/22 >> AHI 2.2, SpO2 low 91%   Please let her know that her sleep study no longer shows sleep apnea.  She no longer needs to wear the [name] device at night any more.

## 2022-03-29 NOTE — Procedures (Signed)
      Patient Name: Regina Long, Regina Long Date: 03/23/2022 Gender: Female D.O.B: 10/02/1975 Age (years): 46 Referring Provider: Geraldo Pitter NP Height (inches): 65 Interpreting Physician: Chesley Mires MD, ABSM Weight (lbs): 118 RPSGT: Carolin Coy BMI: 20 MRN: [id number] Neck Size: 11.50  CLINICAL INFORMATION Sleep Study Type: NPSG  Indication for sleep study: Excessive Daytime Sleepiness, Fatigue, OSA, Snoring  Epworth Sleepiness Score: 10  SLEEP STUDY TECHNIQUE As per the AASM Manual for the Scoring of Sleep and Associated Events v2.3 (April 2016) with a hypopnea requiring 4% desaturations.  The channels recorded and monitored were frontal, central and occipital EEG, electrooculogram (EOG), submentalis EMG (chin), nasal and oral airflow, thoracic and abdominal wall motion, anterior tibialis EMG, snore microphone, electrocardiogram, and pulse oximetry.  MEDICATIONS Medications self-administered by patient taken the night of the study : doterra serenity capusle, DULOXETINE, ESTAZOLAM, Snooze-In  SLEEP ARCHITECTURE The study was initiated at 10:44:41 PM and ended at 4:56:17 AM.  Sleep onset time was 5.2 minutes and the sleep efficiency was 95.8%. The total sleep time was 356 minutes.  Stage REM latency was 82.5 minutes.  The patient spent 7.7% of the night in stage N1 sleep, 73.7% in stage N2 sleep, 0.0% in stage N3 and 18.5% in REM.  Alpha intrusion was absent.  Supine sleep was 57.59%.  RESPIRATORY PARAMETERS The overall apnea/hypopnea index (AHI) was 2.2 per hour. There were 5 total apneas, including 1 obstructive, 4 central and 0 mixed apneas. There were 8 hypopneas and 109 RERAs.  The AHI during Stage REM sleep was 4.5 per hour.  AHI while supine was 1.8 per hour.  The mean oxygen saturation was 95.1%. The minimum SpO2 during sleep was 91.0%.  soft snoring was noted during this study.  CARDIAC DATA The 2 lead EKG demonstrated sinus rhythm. The mean  heart rate was 70.2 beats per minute. Other EKG findings include: PVCs.  LEG MOVEMENT DATA The total PLMS were 0 with a resulting PLMS index of 0.0. Associated arousal with leg movement index was 0.0 .  IMPRESSIONS - No significant obstructive sleep apnea occurred during this study (AHI = 2.2/h). - No significant central sleep apnea occurred during this study (CAI = 0.7/h). - The patient had minimal or no oxygen desaturation during the study (Min O2 = 91.0%) - The patient snored with soft snoring volume. - EKG findings include PVCs. - Clinically, significant periodic limb movements did not occur during sleep. No significant associated arousals.  DIAGNOSIS - Snoring.  RECOMMENDATIONS - Avoid alcohol, sedatives and other CNS depressants that may worsen sleep apnea and disrupt normal sleep architecture. - Sleep hygiene should be reviewed to assess factors that may improve sleep quality. - Weight management and regular exercise should be initiated or continued if appropriate.  [Electronically signed] 03/29/2022 06:00 PM  Chesley Mires MD, ABSM Diplomate, American Board of Sleep Medicine NPI: [id number]  [place] PH: [phone]   FX: [phone] [place]

## 2022-03-31 DIAGNOSIS — M5011 Cervical disc disorder with radiculopathy,  high cervical region: Secondary | ICD-10-CM | POA: Diagnosis not present

## 2022-03-31 DIAGNOSIS — M9901 Segmental and somatic dysfunction of cervical region: Secondary | ICD-10-CM | POA: Diagnosis not present

## 2022-03-31 DIAGNOSIS — M9903 Segmental and somatic dysfunction of lumbar region: Secondary | ICD-10-CM | POA: Diagnosis not present

## 2022-04-05 NOTE — Telephone Encounter (Signed)
Called and went over results with patient.  She states she has some questions about her results and wants to know if Dr. Halford Chessman can call her at [phone] at his convenience. Patient is also okay with having a phone visit scheduled to discuss results if necessary but does not want to come into the office. Dr. Halford Chessman please advise, patient is aware you are out of office until Friday 12/22.

## 2022-04-06 DIAGNOSIS — M9903 Segmental and somatic dysfunction of lumbar region: Secondary | ICD-10-CM | POA: Diagnosis not present

## 2022-04-06 DIAGNOSIS — M9901 Segmental and somatic dysfunction of cervical region: Secondary | ICD-10-CM | POA: Diagnosis not present

## 2022-04-06 DIAGNOSIS — M5011 Cervical disc disorder with radiculopathy,  high cervical region: Secondary | ICD-10-CM | POA: Diagnosis not present

## 2022-04-08 DIAGNOSIS — M9903 Segmental and somatic dysfunction of lumbar region: Secondary | ICD-10-CM | POA: Diagnosis not present

## 2022-04-08 DIAGNOSIS — M5011 Cervical disc disorder with radiculopathy,  high cervical region: Secondary | ICD-10-CM | POA: Diagnosis not present

## 2022-04-08 DIAGNOSIS — M9901 Segmental and somatic dysfunction of cervical region: Secondary | ICD-10-CM | POA: Diagnosis not present

## 2022-04-11 NOTE — Telephone Encounter (Signed)
Please schedule tele visit

## 2022-04-13 NOTE — Telephone Encounter (Signed)
Called and spoke with patient. She is agreeable to phone visit and prefers to be called at main number on file. Appt scheduled. Nothing further needed at this time.

## 2022-04-20 DIAGNOSIS — M9901 Segmental and somatic dysfunction of cervical region: Secondary | ICD-10-CM | POA: Diagnosis not present

## 2022-04-20 DIAGNOSIS — M5011 Cervical disc disorder with radiculopathy,  high cervical region: Secondary | ICD-10-CM | POA: Diagnosis not present

## 2022-04-20 DIAGNOSIS — M9903 Segmental and somatic dysfunction of lumbar region: Secondary | ICD-10-CM | POA: Diagnosis not present

## 2022-04-27 ENCOUNTER — Telehealth: Payer: BC Managed Care – PPO | Admitting: Pulmonary Disease

## 2022-04-28 NOTE — Progress Notes (Signed)
Visit cancelled.

## 2022-05-03 DIAGNOSIS — M5011 Cervical disc disorder with radiculopathy,  high cervical region: Secondary | ICD-10-CM | POA: Diagnosis not present

## 2022-05-03 DIAGNOSIS — M9903 Segmental and somatic dysfunction of lumbar region: Secondary | ICD-10-CM | POA: Diagnosis not present

## 2022-05-03 DIAGNOSIS — M9901 Segmental and somatic dysfunction of cervical region: Secondary | ICD-10-CM | POA: Diagnosis not present

## 2022-05-07 ENCOUNTER — Telehealth (INDEPENDENT_AMBULATORY_CARE_PROVIDER_SITE_OTHER): Payer: BC Managed Care – PPO | Admitting: Pulmonary Disease

## 2022-05-07 ENCOUNTER — Encounter (HOSPITAL_BASED_OUTPATIENT_CLINIC_OR_DEPARTMENT_OTHER): Payer: Self-pay | Admitting: Pulmonary Disease

## 2022-05-07 DIAGNOSIS — F5101 Primary insomnia: Secondary | ICD-10-CM | POA: Diagnosis not present

## 2022-05-07 DIAGNOSIS — G4731 Primary central sleep apnea: Secondary | ICD-10-CM

## 2022-05-07 NOTE — Progress Notes (Signed)
[facility]  Chief Complaint  Patient presents with   Follow-up    Pt states she is staying very tired since LOV. Pt states she still thinks she has Apnea because she is waking herself up snoring.     Constitutional:  There were no vitals taken for this visit.  Past Medical History:  Chronic headaches, Fibromyalgia, Chronic pain, DJD, IBS, Memory difficulties  Past Surgical History:  Her  has a past surgical history that includes Tubal ligation and 3 laproscopies.  Brief Summary:  Regina Long is a 47 y.o. 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  She has history of chronic pain with chronic opiate medication use.      Subjective:   Virtual Visit via Video Note  I connected with Ronnald Collum on 05/07/22 at 11:30 AM EST by a video enabled telemedicine application and verified that I am speaking with the correct person using two identifiers.  Location: Patient: home Provider: medical office   I discussed the limitations of evaluation and management by telemedicine and the availability of in person appointments. The patient expressed understanding and agreed to proceed.  History of Present Illness:  She tried sleeping without [name] device.  Sleep is miserable.  She snores and wakes up snoring.  She can't sleep for more than 30 to 45 minutes at a stretch.    She has been using estazolam for years.  This helps her fall asleep, but not stay asleep.  She uses temazepam in between when she can get her prescription filled.  Temazepam doesn't help as much.  She tried trazodone previously and this didn't help.  She takes duloxetine at night also.  Physical Exam:   Speech fluent.   Sleep Tests:  PSG 12/05/11 >> AHI 16.4, SpO2 low 86% >> Central apnea index 11.1, Obstructive apnea index 1.2, Mixed apnea index 0.2, Hypopnea index 3.9. This is consistent with central sleep apnea.  ONO with CPAP and RA 05/01/12 >> Test time 6 hrs 19 min. Mean SpO2  97%, low SpO2 79%. Spent 12 sec with SpO2 < 88%. Supplemental oxygen not indicated.  05/02/12 CPAP mask desensitization [name] 12/06/20 to 01/04/21 >> used on 30 of 30 nights with average 8 hrs 3 min.  Average AHI 1.1 with median IPAP 12, EPAP 7, PS 4 cm H2O.  Social History:  She  reports that she quit smoking about 18 years ago. Her smoking use included cigarettes. She has never used smokeless tobacco. She reports that she does not drink alcohol and does not use drugs.  Family History:  Her family history includes Cancer in an other family member; Heart attack in her maternal grandfather; Heart disease in an other family member; Prostate cancer in her father.     Assessment/Plan:   Complex sleep apnea. - she has obstructive sleep apnea, and central sleep apnea in setting of chronic opiate medication use - she was tried on CPAP therapy but had persistent central apneas associated with oxygen desaturation - advised her to resume using [name] device - she is complaint with [name] and reports benefit from therapy - she uses [employer] for her DME - continue [name] with max PS 17, min PS 4, EPAP 7 cm H2O  - will see if we can arrange for a repeat home sleep study to see how severe her sleep apnea is given the changes in her sleep pattern and to make sure she can keep up with insurance coverage for therapy  Insomnia. -  she is on high dose os estazolam, and wouldn't recommend any higher dose - previous trial of trazodone didn't help - main issue is with staying asleep - she will check with sleep aide medications are covered by her insurance and let us know - advised to try switching duloxetine to the morning  Time Spent Involved in Patient Care on Day of Examination:   I discussed the assessment and treatment plan with the patient. The patient was provided an opportunity to ask questions and all were answered. The patient agreed with the plan and demonstrated an understanding of the instructions.   The  patient was advised to call back or seek an in-person evaluation if the symptoms worsen or if the condition fails to improve as anticipated.  I provided 36 minutes of non-face-to-face time during this encounter.  Follow up:   Patient Instructions  Try taking duloxetine in the morning.  Check which insomnia medicines are covered by your insurance and let us know.  Start using your sleep apnea machine again.  Will arrange for home sleep study.  Will call to arrange for follow up after sleep study reviewed.   Medication List:   Allergies as of 05/07/2022       Reactions   Influenza Vaccines Other (See Comments)   Flare up of fibromyalgia   Ritalin [methylphenidate Hcl] Other (See Comments)   Pt states that this medication "knocked her out".          Medication List        Accurate as of May 07, 2022 12:07 PM. If you have any questions, ask your nurse or doctor.          DULoxetine 30 MG capsule Commonly known as: CYMBALTA duloxetine 30 mg capsule,delayed release   estazolam 2 MG tablet Commonly known as: PROSOM Take 2 mg by mouth at bedtime.   gabapentin 300 MG capsule Commonly known as: NEURONTIN Take 300 mg by mouth 2 (two) times daily.   Mirena (52 MG) 20 MCG/DAY Iud Generic drug: levonorgestrel Mirena 20 mcg/24 hours (8 yrs) 52 mg intrauterine device  provided [facility]   morphine 15 MG tablet Commonly known as: MSIR Take by mouth.   temazepam 7.5 MG capsule Commonly known as: RESTORIL Take 7.5 mg by mouth at bedtime as needed.   UNABLE TO FIND Med Name: [facility] Life Long Vitality Pack (xEOmega, AlphaCRS, Micro Plex VMz)        Signature:  Chesley Mires, MD [place] Pager - [phone] 05/07/2022, 12:07 PM

## 2022-05-07 NOTE — Patient Instructions (Signed)
Try taking duloxetine in the morning.  Check which insomnia medicines are covered by your insurance and let us know.  Start using your sleep apnea machine again.  Will arrange for home sleep study.  Will call to arrange for follow up after sleep study reviewed.

## 2022-05-10 DIAGNOSIS — M9901 Segmental and somatic dysfunction of cervical region: Secondary | ICD-10-CM | POA: Diagnosis not present

## 2022-05-10 DIAGNOSIS — M5011 Cervical disc disorder with radiculopathy,  high cervical region: Secondary | ICD-10-CM | POA: Diagnosis not present

## 2022-05-10 DIAGNOSIS — M9903 Segmental and somatic dysfunction of lumbar region: Secondary | ICD-10-CM | POA: Diagnosis not present

## 2022-05-11 ENCOUNTER — Telehealth: Payer: Self-pay | Admitting: Pulmonary Disease

## 2022-05-11 DIAGNOSIS — G4733 Obstructive sleep apnea (adult) (pediatric): Secondary | ICD-10-CM

## 2022-05-11 DIAGNOSIS — G4731 Primary central sleep apnea: Secondary | ICD-10-CM

## 2022-05-12 NOTE — Telephone Encounter (Signed)
Called patient to confirm that she was needing the headgear for her CPAP machine. She verified the references number for the product. Order sent to [place]. Will have Sarah sign since Dr Halford Chessman is out sick. Nothing further needed

## 2022-05-17 DIAGNOSIS — M9901 Segmental and somatic dysfunction of cervical region: Secondary | ICD-10-CM | POA: Diagnosis not present

## 2022-05-17 DIAGNOSIS — M5011 Cervical disc disorder with radiculopathy,  high cervical region: Secondary | ICD-10-CM | POA: Diagnosis not present

## 2022-05-17 DIAGNOSIS — M9903 Segmental and somatic dysfunction of lumbar region: Secondary | ICD-10-CM | POA: Diagnosis not present

## 2022-05-24 DIAGNOSIS — M5011 Cervical disc disorder with radiculopathy,  high cervical region: Secondary | ICD-10-CM | POA: Diagnosis not present

## 2022-05-24 DIAGNOSIS — M9903 Segmental and somatic dysfunction of lumbar region: Secondary | ICD-10-CM | POA: Diagnosis not present

## 2022-05-24 DIAGNOSIS — M9901 Segmental and somatic dysfunction of cervical region: Secondary | ICD-10-CM | POA: Diagnosis not present

## 2022-05-28 ENCOUNTER — Ambulatory Visit (HOSPITAL_BASED_OUTPATIENT_CLINIC_OR_DEPARTMENT_OTHER): Payer: BC Managed Care – PPO | Attending: Internal Medicine

## 2022-05-31 DIAGNOSIS — M9901 Segmental and somatic dysfunction of cervical region: Secondary | ICD-10-CM | POA: Diagnosis not present

## 2022-05-31 DIAGNOSIS — M9903 Segmental and somatic dysfunction of lumbar region: Secondary | ICD-10-CM | POA: Diagnosis not present

## 2022-05-31 DIAGNOSIS — M5011 Cervical disc disorder with radiculopathy,  high cervical region: Secondary | ICD-10-CM | POA: Diagnosis not present

## 2022-06-08 DIAGNOSIS — M9903 Segmental and somatic dysfunction of lumbar region: Secondary | ICD-10-CM | POA: Diagnosis not present

## 2022-06-08 DIAGNOSIS — M5011 Cervical disc disorder with radiculopathy,  high cervical region: Secondary | ICD-10-CM | POA: Diagnosis not present

## 2022-06-08 DIAGNOSIS — M9901 Segmental and somatic dysfunction of cervical region: Secondary | ICD-10-CM | POA: Diagnosis not present

## 2022-06-14 DIAGNOSIS — M9903 Segmental and somatic dysfunction of lumbar region: Secondary | ICD-10-CM | POA: Diagnosis not present

## 2022-06-14 DIAGNOSIS — M9901 Segmental and somatic dysfunction of cervical region: Secondary | ICD-10-CM | POA: Diagnosis not present

## 2022-06-14 DIAGNOSIS — M5011 Cervical disc disorder with radiculopathy,  high cervical region: Secondary | ICD-10-CM | POA: Diagnosis not present

## 2022-06-17 DIAGNOSIS — G4733 Obstructive sleep apnea (adult) (pediatric): Secondary | ICD-10-CM | POA: Diagnosis not present

## 2022-06-21 DIAGNOSIS — M9901 Segmental and somatic dysfunction of cervical region: Secondary | ICD-10-CM | POA: Diagnosis not present

## 2022-06-21 DIAGNOSIS — M5011 Cervical disc disorder with radiculopathy,  high cervical region: Secondary | ICD-10-CM | POA: Diagnosis not present

## 2022-06-21 DIAGNOSIS — M9903 Segmental and somatic dysfunction of lumbar region: Secondary | ICD-10-CM | POA: Diagnosis not present

## 2022-07-07 DIAGNOSIS — M9903 Segmental and somatic dysfunction of lumbar region: Secondary | ICD-10-CM | POA: Diagnosis not present

## 2022-07-07 DIAGNOSIS — M5011 Cervical disc disorder with radiculopathy,  high cervical region: Secondary | ICD-10-CM | POA: Diagnosis not present

## 2022-07-07 DIAGNOSIS — M9901 Segmental and somatic dysfunction of cervical region: Secondary | ICD-10-CM | POA: Diagnosis not present

## 2022-07-14 DIAGNOSIS — G4733 Obstructive sleep apnea (adult) (pediatric): Secondary | ICD-10-CM | POA: Diagnosis not present

## 2022-07-15 DIAGNOSIS — M5011 Cervical disc disorder with radiculopathy,  high cervical region: Secondary | ICD-10-CM | POA: Diagnosis not present

## 2022-07-15 DIAGNOSIS — M9901 Segmental and somatic dysfunction of cervical region: Secondary | ICD-10-CM | POA: Diagnosis not present

## 2022-07-15 DIAGNOSIS — M9903 Segmental and somatic dysfunction of lumbar region: Secondary | ICD-10-CM | POA: Diagnosis not present

## 2022-07-26 DIAGNOSIS — M9903 Segmental and somatic dysfunction of lumbar region: Secondary | ICD-10-CM | POA: Diagnosis not present

## 2022-07-26 DIAGNOSIS — M5011 Cervical disc disorder with radiculopathy,  high cervical region: Secondary | ICD-10-CM | POA: Diagnosis not present

## 2022-07-26 DIAGNOSIS — M9901 Segmental and somatic dysfunction of cervical region: Secondary | ICD-10-CM | POA: Diagnosis not present

## 2022-08-02 DIAGNOSIS — M9903 Segmental and somatic dysfunction of lumbar region: Secondary | ICD-10-CM | POA: Diagnosis not present

## 2022-08-02 DIAGNOSIS — M9901 Segmental and somatic dysfunction of cervical region: Secondary | ICD-10-CM | POA: Diagnosis not present

## 2022-08-02 DIAGNOSIS — M5011 Cervical disc disorder with radiculopathy,  high cervical region: Secondary | ICD-10-CM | POA: Diagnosis not present

## 2022-08-09 DIAGNOSIS — M9901 Segmental and somatic dysfunction of cervical region: Secondary | ICD-10-CM | POA: Diagnosis not present

## 2022-08-09 DIAGNOSIS — M9903 Segmental and somatic dysfunction of lumbar region: Secondary | ICD-10-CM | POA: Diagnosis not present

## 2022-08-09 DIAGNOSIS — M5011 Cervical disc disorder with radiculopathy,  high cervical region: Secondary | ICD-10-CM | POA: Diagnosis not present

## 2022-08-12 DIAGNOSIS — G4733 Obstructive sleep apnea (adult) (pediatric): Secondary | ICD-10-CM | POA: Diagnosis not present

## 2022-08-15 DIAGNOSIS — F32A Depression, unspecified: Secondary | ICD-10-CM | POA: Diagnosis not present

## 2022-08-15 DIAGNOSIS — R109 Unspecified abdominal pain: Secondary | ICD-10-CM | POA: Diagnosis not present

## 2022-08-15 DIAGNOSIS — T68XXXA Hypothermia, initial encounter: Secondary | ICD-10-CM | POA: Diagnosis not present

## 2022-08-15 DIAGNOSIS — N10 Acute tubulo-interstitial nephritis: Secondary | ICD-10-CM | POA: Diagnosis not present

## 2022-08-15 DIAGNOSIS — N110 Nonobstructive reflux-associated chronic pyelonephritis: Secondary | ICD-10-CM | POA: Diagnosis not present

## 2022-08-15 DIAGNOSIS — K5900 Constipation, unspecified: Secondary | ICD-10-CM | POA: Diagnosis not present

## 2022-08-15 DIAGNOSIS — Z87891 Personal history of nicotine dependence: Secondary | ICD-10-CM | POA: Diagnosis not present

## 2022-08-15 DIAGNOSIS — R636 Underweight: Secondary | ICD-10-CM | POA: Diagnosis not present

## 2022-08-15 DIAGNOSIS — M797 Fibromyalgia: Secondary | ICD-10-CM | POA: Diagnosis not present

## 2022-08-15 DIAGNOSIS — N12 Tubulo-interstitial nephritis, not specified as acute or chronic: Secondary | ICD-10-CM | POA: Diagnosis not present

## 2022-08-15 DIAGNOSIS — Z79899 Other long term (current) drug therapy: Secondary | ICD-10-CM | POA: Diagnosis not present

## 2022-08-15 DIAGNOSIS — A419 Sepsis, unspecified organism: Secondary | ICD-10-CM | POA: Diagnosis not present

## 2022-08-15 DIAGNOSIS — G8929 Other chronic pain: Secondary | ICD-10-CM | POA: Diagnosis not present

## 2022-08-15 DIAGNOSIS — E860 Dehydration: Secondary | ICD-10-CM | POA: Diagnosis not present

## 2022-08-15 DIAGNOSIS — F419 Anxiety disorder, unspecified: Secondary | ICD-10-CM | POA: Diagnosis not present

## 2022-08-15 DIAGNOSIS — E878 Other disorders of electrolyte and fluid balance, not elsewhere classified: Secondary | ICD-10-CM | POA: Diagnosis not present

## 2022-08-15 DIAGNOSIS — A4151 Sepsis due to Escherichia coli [E. coli]: Secondary | ICD-10-CM | POA: Diagnosis not present

## 2022-08-15 DIAGNOSIS — B9620 Unspecified Escherichia coli [E. coli] as the cause of diseases classified elsewhere: Secondary | ICD-10-CM | POA: Diagnosis not present

## 2022-08-15 DIAGNOSIS — I959 Hypotension, unspecified: Secondary | ICD-10-CM | POA: Diagnosis not present

## 2022-08-15 DIAGNOSIS — E871 Hypo-osmolality and hyponatremia: Secondary | ICD-10-CM | POA: Diagnosis not present

## 2022-08-15 DIAGNOSIS — Z6822 Body mass index (BMI) 22.0-22.9, adult: Secondary | ICD-10-CM | POA: Diagnosis not present

## 2022-08-18 ENCOUNTER — Telehealth: Payer: Self-pay

## 2022-08-18 NOTE — Transitions of Care (Post Inpatient/ED Visit) (Signed)
   08/18/2022  Name: Regina Long MRN: [id number] DOB: 1975-05-28  Today's TOC FU Call Status: Today's TOC FU Call Status:: Successful TOC FU Call Competed  Attempted to reach the patient regarding the most recent Inpatient/ED visit.  Follow Up Plan: No further outreach attempts will be made at this time. We have been unable to contact the patient.  Signature  Fredirick Maudlin [facility]

## 2022-08-25 DIAGNOSIS — G4719 Other hypersomnia: Secondary | ICD-10-CM | POA: Diagnosis not present

## 2022-08-25 DIAGNOSIS — G4733 Obstructive sleep apnea (adult) (pediatric): Secondary | ICD-10-CM | POA: Diagnosis not present

## 2022-08-25 DIAGNOSIS — M797 Fibromyalgia: Secondary | ICD-10-CM | POA: Diagnosis not present

## 2022-08-25 DIAGNOSIS — N12 Tubulo-interstitial nephritis, not specified as acute or chronic: Secondary | ICD-10-CM | POA: Diagnosis not present

## 2022-08-30 ENCOUNTER — Other Ambulatory Visit (HOSPITAL_BASED_OUTPATIENT_CLINIC_OR_DEPARTMENT_OTHER): Payer: Self-pay | Admitting: Family Medicine

## 2022-08-30 DIAGNOSIS — R102 Pelvic and perineal pain: Secondary | ICD-10-CM | POA: Diagnosis not present

## 2022-08-30 DIAGNOSIS — M5011 Cervical disc disorder with radiculopathy,  high cervical region: Secondary | ICD-10-CM | POA: Diagnosis not present

## 2022-08-30 DIAGNOSIS — M9901 Segmental and somatic dysfunction of cervical region: Secondary | ICD-10-CM | POA: Diagnosis not present

## 2022-08-30 DIAGNOSIS — R109 Unspecified abdominal pain: Secondary | ICD-10-CM | POA: Diagnosis not present

## 2022-08-30 DIAGNOSIS — M9903 Segmental and somatic dysfunction of lumbar region: Secondary | ICD-10-CM | POA: Diagnosis not present

## 2022-08-31 ENCOUNTER — Ambulatory Visit (HOSPITAL_BASED_OUTPATIENT_CLINIC_OR_DEPARTMENT_OTHER)
Admission: RE | Admit: 2022-08-31 | Discharge: 2022-08-31 | Disposition: A | Discharge status: Home / Self Care | Payer: BC Managed Care – PPO | Source: Ambulatory Visit | Attending: Family Medicine | Admitting: Family Medicine

## 2022-08-31 ENCOUNTER — Inpatient Hospital Stay: Payer: BC Managed Care – PPO | Admitting: Internal Medicine

## 2022-08-31 DIAGNOSIS — R109 Unspecified abdominal pain: Secondary | ICD-10-CM | POA: Diagnosis not present

## 2022-08-31 MED ORDER — IOHEXOL 300 MG/ML  SOLN
100.0000 mL | Freq: Once | INTRAMUSCULAR | Status: AC | PRN
  Administered 2022-08-31: 80 mL via INTRAVENOUS

## 2022-09-08 DIAGNOSIS — M5011 Cervical disc disorder with radiculopathy,  high cervical region: Secondary | ICD-10-CM | POA: Diagnosis not present

## 2022-09-08 DIAGNOSIS — M9903 Segmental and somatic dysfunction of lumbar region: Secondary | ICD-10-CM | POA: Diagnosis not present

## 2022-09-08 DIAGNOSIS — M9901 Segmental and somatic dysfunction of cervical region: Secondary | ICD-10-CM | POA: Diagnosis not present

## 2022-09-14 DIAGNOSIS — M5011 Cervical disc disorder with radiculopathy,  high cervical region: Secondary | ICD-10-CM | POA: Diagnosis not present

## 2022-09-14 DIAGNOSIS — M9903 Segmental and somatic dysfunction of lumbar region: Secondary | ICD-10-CM | POA: Diagnosis not present

## 2022-09-14 DIAGNOSIS — M9901 Segmental and somatic dysfunction of cervical region: Secondary | ICD-10-CM | POA: Diagnosis not present

## 2022-09-16 DIAGNOSIS — M9903 Segmental and somatic dysfunction of lumbar region: Secondary | ICD-10-CM | POA: Diagnosis not present

## 2022-09-16 DIAGNOSIS — M5011 Cervical disc disorder with radiculopathy,  high cervical region: Secondary | ICD-10-CM | POA: Diagnosis not present

## 2022-09-16 DIAGNOSIS — M9901 Segmental and somatic dysfunction of cervical region: Secondary | ICD-10-CM | POA: Diagnosis not present

## 2022-09-27 DIAGNOSIS — M9903 Segmental and somatic dysfunction of lumbar region: Secondary | ICD-10-CM | POA: Diagnosis not present

## 2022-09-27 DIAGNOSIS — M9901 Segmental and somatic dysfunction of cervical region: Secondary | ICD-10-CM | POA: Diagnosis not present

## 2022-09-27 DIAGNOSIS — M5011 Cervical disc disorder with radiculopathy,  high cervical region: Secondary | ICD-10-CM | POA: Diagnosis not present

## 2022-09-29 DIAGNOSIS — M9903 Segmental and somatic dysfunction of lumbar region: Secondary | ICD-10-CM | POA: Diagnosis not present

## 2022-09-29 DIAGNOSIS — M5011 Cervical disc disorder with radiculopathy,  high cervical region: Secondary | ICD-10-CM | POA: Diagnosis not present

## 2022-09-29 DIAGNOSIS — M9901 Segmental and somatic dysfunction of cervical region: Secondary | ICD-10-CM | POA: Diagnosis not present

## 2022-10-11 DIAGNOSIS — M5011 Cervical disc disorder with radiculopathy,  high cervical region: Secondary | ICD-10-CM | POA: Diagnosis not present

## 2022-10-11 DIAGNOSIS — M9903 Segmental and somatic dysfunction of lumbar region: Secondary | ICD-10-CM | POA: Diagnosis not present

## 2022-10-11 DIAGNOSIS — M9901 Segmental and somatic dysfunction of cervical region: Secondary | ICD-10-CM | POA: Diagnosis not present

## 2022-10-13 DIAGNOSIS — M5011 Cervical disc disorder with radiculopathy,  high cervical region: Secondary | ICD-10-CM | POA: Diagnosis not present

## 2022-10-13 DIAGNOSIS — M9903 Segmental and somatic dysfunction of lumbar region: Secondary | ICD-10-CM | POA: Diagnosis not present

## 2022-10-13 DIAGNOSIS — M9901 Segmental and somatic dysfunction of cervical region: Secondary | ICD-10-CM | POA: Diagnosis not present

## 2022-10-25 DIAGNOSIS — M9903 Segmental and somatic dysfunction of lumbar region: Secondary | ICD-10-CM | POA: Diagnosis not present

## 2022-10-25 DIAGNOSIS — M5011 Cervical disc disorder with radiculopathy,  high cervical region: Secondary | ICD-10-CM | POA: Diagnosis not present

## 2022-10-25 DIAGNOSIS — M9901 Segmental and somatic dysfunction of cervical region: Secondary | ICD-10-CM | POA: Diagnosis not present

## 2022-11-08 DIAGNOSIS — M9901 Segmental and somatic dysfunction of cervical region: Secondary | ICD-10-CM | POA: Diagnosis not present

## 2022-11-08 DIAGNOSIS — M9903 Segmental and somatic dysfunction of lumbar region: Secondary | ICD-10-CM | POA: Diagnosis not present

## 2022-11-08 DIAGNOSIS — M5011 Cervical disc disorder with radiculopathy,  high cervical region: Secondary | ICD-10-CM | POA: Diagnosis not present

## 2022-11-10 DIAGNOSIS — M5011 Cervical disc disorder with radiculopathy,  high cervical region: Secondary | ICD-10-CM | POA: Diagnosis not present

## 2022-11-10 DIAGNOSIS — M9903 Segmental and somatic dysfunction of lumbar region: Secondary | ICD-10-CM | POA: Diagnosis not present

## 2022-11-10 DIAGNOSIS — M9901 Segmental and somatic dysfunction of cervical region: Secondary | ICD-10-CM | POA: Diagnosis not present

## 2022-11-10 DIAGNOSIS — G4733 Obstructive sleep apnea (adult) (pediatric): Secondary | ICD-10-CM | POA: Diagnosis not present

## 2022-11-17 DIAGNOSIS — M5011 Cervical disc disorder with radiculopathy,  high cervical region: Secondary | ICD-10-CM | POA: Diagnosis not present

## 2022-11-17 DIAGNOSIS — M9901 Segmental and somatic dysfunction of cervical region: Secondary | ICD-10-CM | POA: Diagnosis not present

## 2022-11-17 DIAGNOSIS — M9903 Segmental and somatic dysfunction of lumbar region: Secondary | ICD-10-CM | POA: Diagnosis not present

## 2022-11-23 DIAGNOSIS — M9901 Segmental and somatic dysfunction of cervical region: Secondary | ICD-10-CM | POA: Diagnosis not present

## 2022-11-23 DIAGNOSIS — M5011 Cervical disc disorder with radiculopathy,  high cervical region: Secondary | ICD-10-CM | POA: Diagnosis not present

## 2022-11-23 DIAGNOSIS — M9903 Segmental and somatic dysfunction of lumbar region: Secondary | ICD-10-CM | POA: Diagnosis not present

## 2022-11-29 DIAGNOSIS — M9903 Segmental and somatic dysfunction of lumbar region: Secondary | ICD-10-CM | POA: Diagnosis not present

## 2022-11-29 DIAGNOSIS — M9901 Segmental and somatic dysfunction of cervical region: Secondary | ICD-10-CM | POA: Diagnosis not present

## 2022-11-29 DIAGNOSIS — M5011 Cervical disc disorder with radiculopathy,  high cervical region: Secondary | ICD-10-CM | POA: Diagnosis not present

## 2022-12-06 DIAGNOSIS — M5011 Cervical disc disorder with radiculopathy,  high cervical region: Secondary | ICD-10-CM | POA: Diagnosis not present

## 2022-12-06 DIAGNOSIS — M9901 Segmental and somatic dysfunction of cervical region: Secondary | ICD-10-CM | POA: Diagnosis not present

## 2022-12-06 DIAGNOSIS — M9903 Segmental and somatic dysfunction of lumbar region: Secondary | ICD-10-CM | POA: Diagnosis not present

## 2022-12-13 DIAGNOSIS — M5011 Cervical disc disorder with radiculopathy,  high cervical region: Secondary | ICD-10-CM | POA: Diagnosis not present

## 2022-12-13 DIAGNOSIS — M9903 Segmental and somatic dysfunction of lumbar region: Secondary | ICD-10-CM | POA: Diagnosis not present

## 2022-12-13 DIAGNOSIS — M9901 Segmental and somatic dysfunction of cervical region: Secondary | ICD-10-CM | POA: Diagnosis not present

## 2022-12-15 DIAGNOSIS — M5011 Cervical disc disorder with radiculopathy,  high cervical region: Secondary | ICD-10-CM | POA: Diagnosis not present

## 2022-12-15 DIAGNOSIS — M9901 Segmental and somatic dysfunction of cervical region: Secondary | ICD-10-CM | POA: Diagnosis not present

## 2022-12-15 DIAGNOSIS — M9903 Segmental and somatic dysfunction of lumbar region: Secondary | ICD-10-CM | POA: Diagnosis not present

## 2022-12-21 DIAGNOSIS — M5011 Cervical disc disorder with radiculopathy,  high cervical region: Secondary | ICD-10-CM | POA: Diagnosis not present

## 2022-12-21 DIAGNOSIS — M9901 Segmental and somatic dysfunction of cervical region: Secondary | ICD-10-CM | POA: Diagnosis not present

## 2022-12-21 DIAGNOSIS — M9903 Segmental and somatic dysfunction of lumbar region: Secondary | ICD-10-CM | POA: Diagnosis not present

## 2022-12-23 ENCOUNTER — Other Ambulatory Visit: Payer: Self-pay | Admitting: Obstetrics and Gynecology

## 2022-12-23 DIAGNOSIS — R928 Other abnormal and inconclusive findings on diagnostic imaging of breast: Secondary | ICD-10-CM

## 2022-12-27 DIAGNOSIS — M9901 Segmental and somatic dysfunction of cervical region: Secondary | ICD-10-CM | POA: Diagnosis not present

## 2022-12-27 DIAGNOSIS — M9903 Segmental and somatic dysfunction of lumbar region: Secondary | ICD-10-CM | POA: Diagnosis not present

## 2022-12-27 DIAGNOSIS — M5011 Cervical disc disorder with radiculopathy,  high cervical region: Secondary | ICD-10-CM | POA: Diagnosis not present

## 2022-12-29 DIAGNOSIS — M5011 Cervical disc disorder with radiculopathy,  high cervical region: Secondary | ICD-10-CM | POA: Diagnosis not present

## 2022-12-29 DIAGNOSIS — M9901 Segmental and somatic dysfunction of cervical region: Secondary | ICD-10-CM | POA: Diagnosis not present

## 2022-12-29 DIAGNOSIS — M9903 Segmental and somatic dysfunction of lumbar region: Secondary | ICD-10-CM | POA: Diagnosis not present

## 2023-01-03 DIAGNOSIS — M9903 Segmental and somatic dysfunction of lumbar region: Secondary | ICD-10-CM | POA: Diagnosis not present

## 2023-01-03 DIAGNOSIS — M9901 Segmental and somatic dysfunction of cervical region: Secondary | ICD-10-CM | POA: Diagnosis not present

## 2023-01-03 DIAGNOSIS — M5011 Cervical disc disorder with radiculopathy,  high cervical region: Secondary | ICD-10-CM | POA: Diagnosis not present

## 2023-01-19 DIAGNOSIS — M9901 Segmental and somatic dysfunction of cervical region: Secondary | ICD-10-CM | POA: Diagnosis not present

## 2023-01-19 DIAGNOSIS — M9903 Segmental and somatic dysfunction of lumbar region: Secondary | ICD-10-CM | POA: Diagnosis not present

## 2023-01-19 DIAGNOSIS — M5011 Cervical disc disorder with radiculopathy,  high cervical region: Secondary | ICD-10-CM | POA: Diagnosis not present

## 2023-01-24 DIAGNOSIS — M5011 Cervical disc disorder with radiculopathy,  high cervical region: Secondary | ICD-10-CM | POA: Diagnosis not present

## 2023-01-24 DIAGNOSIS — M9903 Segmental and somatic dysfunction of lumbar region: Secondary | ICD-10-CM | POA: Diagnosis not present

## 2023-01-24 DIAGNOSIS — M9901 Segmental and somatic dysfunction of cervical region: Secondary | ICD-10-CM | POA: Diagnosis not present

## 2023-01-26 ENCOUNTER — Ambulatory Visit
Admission: RE | Admit: 2023-01-26 | Discharge: 2023-01-26 | Disposition: A | Discharge status: Home / Self Care | Payer: BC Managed Care – PPO | Source: Ambulatory Visit | Attending: Obstetrics and Gynecology | Admitting: Obstetrics and Gynecology

## 2023-01-26 DIAGNOSIS — M9901 Segmental and somatic dysfunction of cervical region: Secondary | ICD-10-CM | POA: Diagnosis not present

## 2023-01-26 DIAGNOSIS — R928 Other abnormal and inconclusive findings on diagnostic imaging of breast: Secondary | ICD-10-CM

## 2023-01-26 DIAGNOSIS — M5011 Cervical disc disorder with radiculopathy,  high cervical region: Secondary | ICD-10-CM | POA: Diagnosis not present

## 2023-01-26 DIAGNOSIS — N6012 Diffuse cystic mastopathy of left breast: Secondary | ICD-10-CM | POA: Diagnosis not present

## 2023-01-26 DIAGNOSIS — N6321 Unspecified lump in the left breast, upper outer quadrant: Secondary | ICD-10-CM | POA: Diagnosis not present

## 2023-01-26 DIAGNOSIS — M9903 Segmental and somatic dysfunction of lumbar region: Secondary | ICD-10-CM | POA: Diagnosis not present

## 2023-02-01 DIAGNOSIS — M9903 Segmental and somatic dysfunction of lumbar region: Secondary | ICD-10-CM | POA: Diagnosis not present

## 2023-02-01 DIAGNOSIS — M9901 Segmental and somatic dysfunction of cervical region: Secondary | ICD-10-CM | POA: Diagnosis not present

## 2023-02-01 DIAGNOSIS — M5011 Cervical disc disorder with radiculopathy,  high cervical region: Secondary | ICD-10-CM | POA: Diagnosis not present

## 2023-02-14 DIAGNOSIS — G4733 Obstructive sleep apnea (adult) (pediatric): Secondary | ICD-10-CM | POA: Diagnosis not present

## 2023-04-06 DIAGNOSIS — Z124 Encounter for screening for malignant neoplasm of cervix: Secondary | ICD-10-CM | POA: Diagnosis not present

## 2023-04-06 DIAGNOSIS — Z01419 Encounter for gynecological examination (general) (routine) without abnormal findings: Secondary | ICD-10-CM | POA: Diagnosis not present

## 2023-05-30 ENCOUNTER — Other Ambulatory Visit: Payer: Self-pay | Admitting: Obstetrics and Gynecology

## 2023-05-30 DIAGNOSIS — N6320 Unspecified lump in the left breast, unspecified quadrant: Secondary | ICD-10-CM

## 2023-06-08 ENCOUNTER — Ambulatory Visit
Admission: RE | Admit: 2023-06-08 | Discharge: 2023-06-08 | Disposition: A | Discharge status: Home / Self Care | Payer: BC Managed Care – PPO | Source: Ambulatory Visit | Attending: Obstetrics and Gynecology | Admitting: Obstetrics and Gynecology

## 2023-06-08 ENCOUNTER — Other Ambulatory Visit: Payer: Self-pay | Admitting: Obstetrics and Gynecology

## 2023-06-08 DIAGNOSIS — N6320 Unspecified lump in the left breast, unspecified quadrant: Secondary | ICD-10-CM

## 2023-06-13 ENCOUNTER — Ambulatory Visit
Admission: RE | Admit: 2023-06-13 | Discharge: 2023-06-13 | Disposition: A | Discharge status: Home / Self Care | Payer: BC Managed Care – PPO | Source: Ambulatory Visit | Attending: Obstetrics and Gynecology | Admitting: Obstetrics and Gynecology

## 2023-06-13 DIAGNOSIS — N6320 Unspecified lump in the left breast, unspecified quadrant: Secondary | ICD-10-CM

## 2023-08-30 ENCOUNTER — Telehealth: Payer: Self-pay

## 2023-08-30 NOTE — Telephone Encounter (Signed)
 Copied from CRM [id number]. Topic: General - Other >> Aug 30, 2023  2:12 PM Eveleen Hinds B wrote: Reason for CRM: Patient called, wanted you to know she has been with Dr Matilde Son 15 years. Has all of meds through fibromyalgia provider. Due to new opiod rules, it's requesting pulm doctor order sleep meds. Need to make sure she can get a new sleep med moving forward as she will be out of sleep meds soon. Need a game plan. Will be in on 5/19 Dr Kieran Pellet. Want to make sure you can view her history. Just want to give a heads up before meeting on Monday.

## 2023-09-01 NOTE — Telephone Encounter (Signed)
 I have notified the patient. Nothing further needed.

## 2023-09-05 ENCOUNTER — Encounter: Admitting: Sleep Medicine

## 2023-12-13 ENCOUNTER — Other Ambulatory Visit: Payer: Self-pay | Admitting: Obstetrics and Gynecology

## 2023-12-13 DIAGNOSIS — Z1231 Encounter for screening mammogram for malignant neoplasm of breast: Secondary | ICD-10-CM

## 2024-01-31 ENCOUNTER — Ambulatory Visit
Admission: RE | Admit: 2024-01-31 | Discharge: 2024-01-31 | Disposition: A | Source: Ambulatory Visit | Attending: Obstetrics and Gynecology | Admitting: Obstetrics and Gynecology

## 2024-01-31 DIAGNOSIS — Z1231 Encounter for screening mammogram for malignant neoplasm of breast: Secondary | ICD-10-CM
